# Patient Record
Sex: Male | Born: 1960 | Hispanic: No | Marital: Married | Smoking: Former smoker
Health system: Southern US, Community
[De-identification: ages and names within clinical notes are randomized; demographics above are authoritative.]

## PROBLEM LIST (undated history)

## (undated) DIAGNOSIS — I10 Essential (primary) hypertension: Secondary | ICD-10-CM

## (undated) HISTORY — PX: HERNIA REPAIR: SHX51

## (undated) HISTORY — PX: APPENDECTOMY: SHX54

---

## 2010-07-02 ENCOUNTER — Emergency Department (HOSPITAL_BASED_OUTPATIENT_CLINIC_OR_DEPARTMENT_OTHER): Admission: EM | Admit: 2010-07-02 | Discharge: 2010-07-02 | Payer: Self-pay | Admitting: Emergency Medicine

## 2010-12-21 ENCOUNTER — Ambulatory Visit (HOSPITAL_BASED_OUTPATIENT_CLINIC_OR_DEPARTMENT_OTHER)
Admission: RE | Admit: 2010-12-21 | Discharge: 2010-12-21 | Disposition: A | Discharge status: Home / Self Care | Payer: PRIVATE HEALTH INSURANCE | Source: Ambulatory Visit | Attending: Orthopedic Surgery | Admitting: Orthopedic Surgery

## 2010-12-21 DIAGNOSIS — E119 Type 2 diabetes mellitus without complications: Secondary | ICD-10-CM | POA: Insufficient documentation

## 2010-12-21 DIAGNOSIS — G5620 Lesion of ulnar nerve, unspecified upper limb: Secondary | ICD-10-CM | POA: Insufficient documentation

## 2010-12-21 LAB — BASIC METABOLIC PANEL
BUN: 16 mg/dL (ref 6–23)
CO2: 25 mEq/L (ref 19–32)
Chloride: 98 mEq/L (ref 96–112)
GFR calc Af Amer: >60 mL/min (ref >60)
Potassium: 4.2 mEq/L (ref 3.5–5.1)

## 2010-12-22 ENCOUNTER — Ambulatory Visit (HOSPITAL_BASED_OUTPATIENT_CLINIC_OR_DEPARTMENT_OTHER)
Admission: RE | Admit: 2010-12-22 | Discharge: 2010-12-22 | Disposition: A | Discharge status: Home / Self Care | Payer: PRIVATE HEALTH INSURANCE | Source: Ambulatory Visit | Attending: Orthopedic Surgery | Admitting: Orthopedic Surgery

## 2010-12-22 DIAGNOSIS — E669 Obesity, unspecified: Secondary | ICD-10-CM | POA: Insufficient documentation

## 2010-12-22 DIAGNOSIS — E119 Type 2 diabetes mellitus without complications: Secondary | ICD-10-CM | POA: Insufficient documentation

## 2010-12-22 DIAGNOSIS — Z01810 Encounter for preprocedural cardiovascular examination: Secondary | ICD-10-CM | POA: Insufficient documentation

## 2010-12-22 DIAGNOSIS — G5620 Lesion of ulnar nerve, unspecified upper limb: Secondary | ICD-10-CM | POA: Insufficient documentation

## 2010-12-22 DIAGNOSIS — Z01812 Encounter for preprocedural laboratory examination: Secondary | ICD-10-CM | POA: Insufficient documentation

## 2010-12-22 LAB — GLUCOSE, CAPILLARY
Glucose-Capillary: 215 mg/dL — ABNORMAL HIGH (ref 70–99)
Glucose-Capillary: 191 mg/dL — ABNORMAL HIGH (ref 70–99)

## 2011-02-18 NOTE — Op Note (Signed)
Kenneth Paul, Kenneth Paul               ACCOUNT NO.:  [id number]  MEDICAL RECORD NO.:  [id number]           PATIENT TYPE:  LOCATION:                                 FACILITY:  PHYSICIAN:  Cindee Salt, M.D.       DATE OF BIRTH:  [birth year]  DATE OF PROCEDURE:  12/22/2010 DATE OF DISCHARGE:                              OPERATIVE REPORT   PREOPERATIVE DIAGNOSIS:  Cubital tunnel syndrome, right arm.  POSTOPERATIVE DIAGNOSIS:  Cubital tunnel syndrome, right arm.  OPERATION:  Subcutaneous transposition with epineurolysis, right ulnar nerve.  SURGEON:  Cindee Salt, MD  ANESTHESIA:  General.  ANESTHESIOLOGIST:  Dr. Jean Rosenthal.  HISTORY:  The patient is a 50 year old male with a history of pain, numbness, and tingling of his right ulnar nerve distribution.  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  He does have diabetes. He has elected to undergo surgical decompression and transposition. Pre, peri, postoperative course have been discussed along with risks and complications.  He is aware that there is no guarantee with the surgery, possibility of infection, recurrence of injury to arteries, nerves, tendons, incomplete relief of symptoms, dystrophy.  In the preoperative area, the patient is seen, the extremity marked by both the patient and surgeon, antibiotic given.  PROCEDURE:  The patient was brought to the operating room where a general anesthetic was carried out without difficulty.  He was prepped using DuraPrep, supine position, right arm free.  A 3-minute dry time was allowed, time-out taken, confirming the patient and procedure.  The limb was exsanguinated with an Esmarch bandage.  Tourniquet placed high on the arm was inflated to 250 mmHg.  An incision was made over the medial epicondyle, carried down through subcutaneous tissue.  Bleeders were electrocauterized.  Posterior branches of the medial antebrachial cutaneous nerve to the forearm were identified.  These  were protected. The dissection carried down through [facility] fascia.  A markedly enlarged ulnar nerve was immediately apparent with a significant compression distally at the level of the entrance into the flexor carpi ulnaris. The nerve was actually very firm and hard in consistency.  With blunt and sharp dissection, a fasciotomy was performed to the flexor carpi ulnaris.  The dissection carried proximally to the arcade of Struthers. The medial intermuscular septum was then detached proximally, left attached to the most lateral aspect of the medial epicondyle distally. Bleeders were electrocauterized with bipolar.  The ulnar nerve and its vascular supply was then meticulously dissected free.  An epineurolysis was performed to the nerve and that the epineurium was markedly thickened.  The nerve was then anteriorly transposed.  A second sling was created distally by taking a portion of the flexor carpi ulnaris origin leaving it attached to the most ulnar aspect of the medial epicondyle.  The nerve was anteriorly transposed.  This was then copiously irrigated with saline.  The two slings were then sutured to the fascia and the subdermal plexus.  The skin was then closed.  There was no further subluxation back to its normal position.  The subcutaneous tissue was then closed with interrupted 2-0 Vicryl and the skin with  a subcuticular 4-0 Vicryl Rapide suture.  A local infiltration with 0.25% Marcaine without epinephrine was given, 10 mL was used.  A sterile compressive dressing, long-arm splint was applied.  On deflation of the tourniquet, all fingers immediately pinked.  He was taken to the recovery room for observation in satisfactory condition.  He will be discharged home to return to the [facility] in 1 week on Vicodin.          ______________________________ Cindee Salt, M.D.     GK/MEDQ  D:  12/22/2010  T:  12/22/2010  Job:  [id number]  Electronically Signed by Cindee Salt M.D. on 02/18/2011 09:14:55 AM

## 2011-10-23 ENCOUNTER — Encounter (HOSPITAL_BASED_OUTPATIENT_CLINIC_OR_DEPARTMENT_OTHER): Payer: Self-pay | Admitting: *Deleted

## 2011-10-23 ENCOUNTER — Emergency Department (INDEPENDENT_AMBULATORY_CARE_PROVIDER_SITE_OTHER): Payer: Managed Care, Other (non HMO)

## 2011-10-23 ENCOUNTER — Emergency Department (HOSPITAL_BASED_OUTPATIENT_CLINIC_OR_DEPARTMENT_OTHER)
Admission: EM | Admit: 2011-10-23 | Discharge: 2011-10-23 | Disposition: A | Discharge status: Home / Self Care | Payer: Managed Care, Other (non HMO) | Attending: Emergency Medicine | Admitting: Emergency Medicine

## 2011-10-23 DIAGNOSIS — M542 Cervicalgia: Secondary | ICD-10-CM | POA: Insufficient documentation

## 2011-10-23 DIAGNOSIS — R29898 Other symptoms and signs involving the musculoskeletal system: Secondary | ICD-10-CM

## 2011-10-23 DIAGNOSIS — M25519 Pain in unspecified shoulder: Secondary | ICD-10-CM

## 2011-10-23 DIAGNOSIS — E119 Type 2 diabetes mellitus without complications: Secondary | ICD-10-CM

## 2011-10-23 DIAGNOSIS — M5412 Radiculopathy, cervical region: Secondary | ICD-10-CM | POA: Insufficient documentation

## 2011-10-23 MED ORDER — IBUPROFEN 800 MG PO TABS
800.0000 mg | ORAL_TABLET | Freq: Once | ORAL | Status: AC
  Administered 2011-10-23: 800 mg via ORAL
  Filled 2011-10-23: qty 1

## 2011-10-23 MED ORDER — IBUPROFEN 600 MG PO TABS: 600.0000 mg | ORAL_TABLET | Freq: Four times a day (QID) | ORAL | Status: AC | PRN

## 2011-10-23 NOTE — ED Notes (Signed)
rx x 1 given for ibuprofen 

## 2011-10-23 NOTE — ED Notes (Signed)
Ice pack provided

## 2011-10-23 NOTE — ED Provider Notes (Signed)
History  This chart was scribed for Kenneth Nielsen, MD by Bennett Scrape. This patient was seen in room MH10/MH10 and the patient's care was started at 12:52AM.  CSN: [id number]  Arrival date & time 10/23/11  4098   First MD Initiated Contact with Patient 10/23/11 0050      Chief Complaint  Patient presents with  . Shoulder Pain    Patient is a 51 y.o. male presenting with shoulder pain. The history is provided by the patient. No language interpreter was used.  Shoulder Pain This is a new problem. The current episode started more than 2 days ago. The problem occurs constantly. The problem has been gradually worsening. Pertinent negatives include no chest pain, no abdominal pain, no headaches and no shortness of breath. The symptoms are relieved by nothing. He has tried nothing for the symptoms.    Kenneth Paul is a 51 y.o. male who presents to the Emergency Department complaining of one week of gradual onset, gradually worsening, constant left shoulder pain with associated weakness. Pt reports that movement makes the pain worse. He rates his pain a 6 or 7 out of 10 at its worse and currently. He has not taken any medications PTA to improve symptoms. He denies any recent injuries as the cause of the pain. He denies any increase in strenuous activity or heavy lifting. He denies any other injuries or illnesses at this time. He has a h/o diabetes. Pt is a former smoker but denies alcohol use.  Pt is currently going to school.  Past Medical History  Diagnosis Date  . Diabetes mellitus     Past Surgical History  Procedure Date  . Appendectomy   . Hernia repair     No family history on file.  History  Substance Use Topics  . Smoking status: Former Games developer  . Smokeless tobacco: Never Used  . Alcohol Use: No      Review of Systems  Constitutional: Negative for fever and chills.  Respiratory: Negative for cough and shortness of breath.   Cardiovascular: Negative for chest pain.   Gastrointestinal: Negative for nausea, vomiting, abdominal pain and diarrhea.  Musculoskeletal: Negative for back pain and joint swelling.       Left shoulder pain  Skin: Negative for rash.  Neurological: Positive for weakness (Increased weakness of left shoulder). Negative for headaches.  All other systems reviewed and are negative.    Allergies  Tetanus toxoids  Home Medications  No current outpatient prescriptions on file.  Triage Vitals: BP 143/91  Pulse 86  Temp(Src) 97.9 F (36.6 C) (Oral)  Resp 18  SpO2 98%   Physical Exam  Nursing note and vitals reviewed. Constitutional: He is oriented to person, place, and time. He appears well-developed and well-nourished.  HENT:  Head: Normocephalic and atraumatic.  Eyes: Conjunctivae and EOM are normal.  Neck: Normal range of motion. Neck supple.  Cardiovascular: Normal rate, regular rhythm and normal heart sounds.   Pulmonary/Chest: Effort normal and breath sounds normal.  Abdominal: Soft. There is no tenderness.  Musculoskeletal: He exhibits tenderness (Reproducible pain with movement and palpation of the left shoulder; no deformity noted, mild left paracervical tenderess). He exhibits no edema.  Neurological: He is alert and oriented to person, place, and time. No cranial nerve deficit.       Neurovascularly intact, strength is normal  Skin: Skin is warm and dry.  Psychiatric: He has a normal mood and affect. His behavior is normal.    ED Course  Procedures (  including critical care time)  DIAGNOSTIC STUDIES: Oxygen Saturation is 98% on room air, normal by my interpretation.    COORDINATION OF CARE: 12:54AM-Discussed treatment plan and x-ray of left shoulder with pt and pt agreed to plan.   Results for orders placed during the hospital encounter of 12/22/10  GLUCOSE, CAPILLARY      Component Value Range   Glucose-Capillary 215 (*) 70 - 99 (mg/dL)  GLUCOSE, CAPILLARY      Component Value Range   Glucose-Capillary  191 (*) 70 - 99 (mg/dL)  POCT HEMOGLOBIN-HEMACUE      Component Value Range   Hemoglobin 13.4  13.0 - 17.0 (g/dL)   Dg Cervical Spine Complete  10/23/2011  *RADIOLOGY REPORT*  Clinical Data: Left shoulder pain and weakness, history diabetes  CERVICAL SPINE - COMPLETE 4+ VIEW  Comparison: 07/02/2010  Findings: Osseous mineralization normal. Disc space narrowing C5-C6. Vertebral body and remaining disc space heights maintained. Prevertebral soft tissues normal thickness. Bony foramina patent. No acute fracture, subluxation, or bone destruction. Visualized upper lungs clear.  IMPRESSION: Mild degenerative disc disease changes C5-C6. No acute abnormalities.  Original Report Authenticated By: Lollie Marrow, M.D.   Dg Shoulder Left  10/23/2011  *RADIOLOGY REPORT*  Clinical Data: Left shoulder pain for 1 week, left shoulder weakness, history diabetes  LEFT SHOULDER - 2+ VIEW  Comparison: None  Findings: AC joint alignment normal. Bone mineralization normal. No glenohumeral fracture or dislocation. Visualized ribs intact.  IMPRESSION: Normal exam.  Original Report Authenticated By: Lollie Marrow, M.D.   Ice and Motrin.    MDM  Presentation suggests cervical radiculopathy with progressive symptoms over the last week. Plan sling, ice, NSAIDs and orthopedic followup. Reliable historian and verbalizes understanding neck and shoulder pain precautions.     I personally performed the services described in this documentation, which was scribed in my presence. The recorded information has been reviewed and considered.         Kenneth Nielsen, MD 10/23/11 [phone]

## 2011-10-23 NOTE — Discharge Instructions (Signed)
Cervical Radiculopathy Cervical radiculopathy happens when a nerve in the neck is pinched or bruised by a slipped (herniated) disk or by arthritic changes in the bones of the cervical spine. This can occur due to an injury or as part of the normal aging process. Pressure on the cervical nerves can cause pain or numbness that runs from your neck all the way down into your arm and fingers. CAUSES  There are many possible causes, including:  Injury.   Muscle tightness in the neck from overuse.   Swollen, painful joints (arthritis).   Breakdown or degeneration in the bones and joints of the spine (spondylosis) due to aging.   Bone spurs that may develop near the cervical nerves.  SYMPTOMS  Symptoms include pain, weakness, or numbness in the affected arm and hand. Pain can be severe or irritating. Symptoms may be worse when extending or turning the neck. DIAGNOSIS  Your caregiver will ask about your symptoms and do a physical exam. He or she may test your strength and reflexes. X-rays, CT scans, and MRI scans may be needed in cases of injury or if the symptoms do not go away after a period of time. Electromyography (EMG) or nerve conduction testing may be done to study how your nerves and muscles are working. TREATMENT  Your caregiver may recommend certain exercises to help relieve your symptoms. Cervical radiculopathy can, and often does, get better with time and treatment. If your problems continue, treatment options may include:  Wearing a soft collar for short periods of time.   Physical therapy to strengthen the neck muscles.   Medicines, such as nonsteroidal anti-inflammatory drugs (NSAIDs), oral corticosteroids, or spinal injections.   Surgery. Different types of surgery may be done depending on the cause of your problems.  HOME CARE INSTRUCTIONS   Put ice on the affected area.   Put ice in a plastic bag.   Place a towel between your skin and the bag.   Leave the ice on for 15  to 20 minutes, 3 to 4 times a day or as directed by your caregiver.   Use a flat pillow when you sleep.   Only take over-the-counter or prescription medicines for pain, discomfort, or fever as directed by your caregiver.   If physical therapy was prescribed, follow your caregiver's directions.   If a soft collar was prescribed, use it as directed.  SEEK IMMEDIATE MEDICAL CARE IF:   Your pain gets much worse and cannot be controlled with medicines.   You have weakness or numbness in your hand, arm, face, or leg.   You have a high fever or a stiff, rigid neck.   You lose bowel or bladder control (incontinence).   You have trouble with walking, balance, or speaking.  MAKE SURE YOU:   Understand these instructions.   Will watch your condition.   Will get help right away if you are not doing well or get worse.  Document Released: 04/12/2001 Document Revised: 07/07/2011 Document Reviewed: 03/01/2011 Exit[employer] Patient Information 2012 [employer]. 

## 2011-10-23 NOTE — ED Notes (Signed)
Pt reports left shoulder pain x 1 week- pain worse since Friday, now worse with movement and radiates down arm and into neck- has been using vick's rub for pain- denies chest pain

## 2012-01-02 ENCOUNTER — Ambulatory Visit: Payer: PRIVATE HEALTH INSURANCE | Attending: Specialist | Admitting: Physical Therapy

## 2012-01-02 DIAGNOSIS — M25519 Pain in unspecified shoulder: Secondary | ICD-10-CM | POA: Insufficient documentation

## 2012-01-02 DIAGNOSIS — M25619 Stiffness of unspecified shoulder, not elsewhere classified: Secondary | ICD-10-CM | POA: Insufficient documentation

## 2012-01-02 DIAGNOSIS — IMO0001 Reserved for inherently not codable concepts without codable children: Secondary | ICD-10-CM | POA: Insufficient documentation

## 2012-01-04 ENCOUNTER — Ambulatory Visit: Payer: PRIVATE HEALTH INSURANCE | Admitting: Rehabilitation

## 2012-01-10 ENCOUNTER — Ambulatory Visit: Payer: PRIVATE HEALTH INSURANCE | Admitting: Physical Therapy

## 2012-01-11 ENCOUNTER — Encounter: Payer: PRIVATE HEALTH INSURANCE | Admitting: Rehabilitation

## 2012-01-13 ENCOUNTER — Ambulatory Visit: Payer: PRIVATE HEALTH INSURANCE | Admitting: Physical Therapy

## 2012-01-17 ENCOUNTER — Ambulatory Visit: Payer: PRIVATE HEALTH INSURANCE | Admitting: Physical Therapy

## 2012-01-24 ENCOUNTER — Ambulatory Visit: Payer: PRIVATE HEALTH INSURANCE | Admitting: Rehabilitation

## 2012-01-25 ENCOUNTER — Ambulatory Visit: Payer: PRIVATE HEALTH INSURANCE | Admitting: Rehabilitation

## 2012-01-30 ENCOUNTER — Ambulatory Visit: Payer: Managed Care, Other (non HMO) | Attending: Specialist

## 2012-01-30 DIAGNOSIS — IMO0001 Reserved for inherently not codable concepts without codable children: Secondary | ICD-10-CM | POA: Insufficient documentation

## 2012-01-30 DIAGNOSIS — M25619 Stiffness of unspecified shoulder, not elsewhere classified: Secondary | ICD-10-CM | POA: Insufficient documentation

## 2012-01-30 DIAGNOSIS — M25519 Pain in unspecified shoulder: Secondary | ICD-10-CM | POA: Insufficient documentation

## 2012-02-01 ENCOUNTER — Ambulatory Visit: Payer: Managed Care, Other (non HMO) | Admitting: Rehabilitation

## 2012-02-07 ENCOUNTER — Ambulatory Visit: Payer: Managed Care, Other (non HMO) | Admitting: Rehabilitation

## 2012-02-09 ENCOUNTER — Ambulatory Visit: Payer: Managed Care, Other (non HMO) | Admitting: Rehabilitation

## 2012-02-10 ENCOUNTER — Ambulatory Visit: Payer: Managed Care, Other (non HMO) | Admitting: Rehabilitation

## 2012-02-13 ENCOUNTER — Ambulatory Visit: Payer: Managed Care, Other (non HMO) | Admitting: Physical Therapy

## 2012-02-16 ENCOUNTER — Ambulatory Visit: Payer: Managed Care, Other (non HMO) | Admitting: Physical Therapy

## 2012-02-20 ENCOUNTER — Ambulatory Visit: Payer: Managed Care, Other (non HMO) | Admitting: Rehabilitation

## 2012-02-22 ENCOUNTER — Ambulatory Visit: Payer: Managed Care, Other (non HMO) | Admitting: Rehabilitation

## 2012-02-27 ENCOUNTER — Ambulatory Visit: Payer: Managed Care, Other (non HMO) | Admitting: Rehabilitation

## 2012-02-29 ENCOUNTER — Encounter: Payer: PRIVATE HEALTH INSURANCE | Admitting: Physical Therapy

## 2012-03-05 ENCOUNTER — Ambulatory Visit: Payer: Managed Care, Other (non HMO) | Attending: Specialist | Admitting: Physical Therapy

## 2012-03-05 DIAGNOSIS — M25619 Stiffness of unspecified shoulder, not elsewhere classified: Secondary | ICD-10-CM | POA: Insufficient documentation

## 2012-03-05 DIAGNOSIS — M25519 Pain in unspecified shoulder: Secondary | ICD-10-CM | POA: Insufficient documentation

## 2012-03-05 DIAGNOSIS — IMO0001 Reserved for inherently not codable concepts without codable children: Secondary | ICD-10-CM | POA: Insufficient documentation

## 2012-03-08 ENCOUNTER — Ambulatory Visit: Payer: Managed Care, Other (non HMO) | Admitting: Rehabilitation

## 2012-03-12 ENCOUNTER — Ambulatory Visit: Payer: Managed Care, Other (non HMO) | Admitting: Rehabilitation

## 2012-03-15 ENCOUNTER — Ambulatory Visit: Payer: Managed Care, Other (non HMO) | Admitting: Rehabilitation

## 2012-03-19 ENCOUNTER — Ambulatory Visit: Payer: Managed Care, Other (non HMO) | Admitting: Rehabilitation

## 2012-03-22 ENCOUNTER — Ambulatory Visit: Payer: Managed Care, Other (non HMO) | Admitting: Rehabilitation

## 2012-04-09 ENCOUNTER — Ambulatory Visit: Payer: Managed Care, Other (non HMO) | Attending: Specialist | Admitting: Physical Therapy

## 2012-04-09 DIAGNOSIS — M25619 Stiffness of unspecified shoulder, not elsewhere classified: Secondary | ICD-10-CM | POA: Insufficient documentation

## 2012-04-09 DIAGNOSIS — IMO0001 Reserved for inherently not codable concepts without codable children: Secondary | ICD-10-CM | POA: Insufficient documentation

## 2012-04-09 DIAGNOSIS — M25519 Pain in unspecified shoulder: Secondary | ICD-10-CM | POA: Insufficient documentation

## 2012-04-11 ENCOUNTER — Encounter: Payer: PRIVATE HEALTH INSURANCE | Admitting: Physical Therapy

## 2012-04-13 ENCOUNTER — Ambulatory Visit: Payer: Managed Care, Other (non HMO) | Admitting: Physical Therapy

## 2012-04-16 ENCOUNTER — Ambulatory Visit: Payer: Managed Care, Other (non HMO) | Admitting: Physical Therapy

## 2012-04-19 ENCOUNTER — Ambulatory Visit: Payer: Managed Care, Other (non HMO) | Admitting: Rehabilitation

## 2012-04-23 ENCOUNTER — Ambulatory Visit: Payer: Managed Care, Other (non HMO) | Admitting: Physical Therapy

## 2012-04-26 ENCOUNTER — Ambulatory Visit: Payer: Managed Care, Other (non HMO) | Admitting: Rehabilitation

## 2012-04-30 ENCOUNTER — Encounter: Payer: PRIVATE HEALTH INSURANCE | Admitting: Physical Therapy

## 2012-05-03 ENCOUNTER — Ambulatory Visit: Payer: Managed Care, Other (non HMO) | Attending: Specialist | Admitting: Rehabilitation

## 2012-05-03 DIAGNOSIS — M25519 Pain in unspecified shoulder: Secondary | ICD-10-CM | POA: Insufficient documentation

## 2012-05-03 DIAGNOSIS — M25619 Stiffness of unspecified shoulder, not elsewhere classified: Secondary | ICD-10-CM | POA: Insufficient documentation

## 2012-05-03 DIAGNOSIS — IMO0001 Reserved for inherently not codable concepts without codable children: Secondary | ICD-10-CM | POA: Insufficient documentation

## 2012-05-08 ENCOUNTER — Ambulatory Visit: Payer: Managed Care, Other (non HMO) | Admitting: Physical Therapy

## 2012-05-10 ENCOUNTER — Encounter: Payer: PRIVATE HEALTH INSURANCE | Admitting: Rehabilitation

## 2012-05-10 ENCOUNTER — Ambulatory Visit: Payer: Managed Care, Other (non HMO) | Admitting: Rehabilitation

## 2012-05-28 ENCOUNTER — Ambulatory Visit: Payer: Managed Care, Other (non HMO) | Admitting: Physical Therapy

## 2012-06-04 ENCOUNTER — Ambulatory Visit: Payer: Managed Care, Other (non HMO) | Attending: Specialist | Admitting: Rehabilitation

## 2012-06-04 DIAGNOSIS — M25519 Pain in unspecified shoulder: Secondary | ICD-10-CM | POA: Insufficient documentation

## 2012-06-04 DIAGNOSIS — M25619 Stiffness of unspecified shoulder, not elsewhere classified: Secondary | ICD-10-CM | POA: Insufficient documentation

## 2012-06-04 DIAGNOSIS — IMO0001 Reserved for inherently not codable concepts without codable children: Secondary | ICD-10-CM | POA: Insufficient documentation

## 2012-06-11 ENCOUNTER — Encounter: Payer: Managed Care, Other (non HMO) | Admitting: Physical Therapy

## 2012-06-18 ENCOUNTER — Ambulatory Visit: Payer: Managed Care, Other (non HMO) | Admitting: Rehabilitation

## 2012-06-25 ENCOUNTER — Ambulatory Visit: Payer: Managed Care, Other (non HMO) | Admitting: Physical Therapy

## 2014-11-12 ENCOUNTER — Emergency Department (HOSPITAL_BASED_OUTPATIENT_CLINIC_OR_DEPARTMENT_OTHER)
Admission: EM | Admit: 2014-11-12 | Discharge: 2014-11-12 | Disposition: A | Discharge status: Home / Self Care | Payer: BLUE CROSS/BLUE SHIELD | Attending: Emergency Medicine | Admitting: Emergency Medicine

## 2014-11-12 ENCOUNTER — Encounter (HOSPITAL_BASED_OUTPATIENT_CLINIC_OR_DEPARTMENT_OTHER): Payer: Self-pay | Admitting: Emergency Medicine

## 2014-11-12 DIAGNOSIS — R739 Hyperglycemia, unspecified: Secondary | ICD-10-CM

## 2014-11-12 DIAGNOSIS — I10 Essential (primary) hypertension: Secondary | ICD-10-CM | POA: Diagnosis not present

## 2014-11-12 DIAGNOSIS — Z87891 Personal history of nicotine dependence: Secondary | ICD-10-CM | POA: Diagnosis not present

## 2014-11-12 DIAGNOSIS — Z79899 Other long term (current) drug therapy: Secondary | ICD-10-CM | POA: Insufficient documentation

## 2014-11-12 DIAGNOSIS — E1165 Type 2 diabetes mellitus with hyperglycemia: Secondary | ICD-10-CM | POA: Diagnosis not present

## 2014-11-12 HISTORY — DX: Essential (primary) hypertension: I10

## 2014-11-12 LAB — BASIC METABOLIC PANEL
ANION GAP: 7 (ref 5–15)
BUN: 11 mg/dL (ref 6–23)
CHLORIDE: 101 mmol/L (ref 96–112)
CO2: 26 mmol/L (ref 19–32)
CREATININE: 0.87 mg/dL (ref 0.50–1.35)
Calcium: 8.7 mg/dL (ref 8.4–10.5)
GFR calc non Af Amer: >90 mL/min (ref >90)
Glucose, Bld: 358 mg/dL — ABNORMAL HIGH (ref 70–99)
POTASSIUM: 4 mmol/L (ref 3.5–5.1)
SODIUM: 134 mmol/L — AB (ref 135–145)

## 2014-11-12 LAB — CBC WITH DIFFERENTIAL/PLATELET
BASOS PCT: 0 % (ref 0–1)
Basophils Absolute: 0 10*3/uL (ref 0.0–0.1)
EOS ABS: 0.2 10*3/uL (ref 0.0–0.7)
EOS PCT: 5 % (ref 0–5)
HEMATOCRIT: 38.6 % — AB (ref 39.0–52.0)
Hemoglobin: 12.4 g/dL — ABNORMAL LOW (ref 13.0–17.0)
Lymphocytes Relative: 39 % (ref 12–46)
Lymphs Abs: 1.3 10*3/uL (ref 0.7–4.0)
MCH: 27.3 pg (ref 26.0–34.0)
MCHC: 32.1 g/dL (ref 30.0–36.0)
MCV: 85 fL (ref 78.0–100.0)
MONO ABS: 0.4 10*3/uL (ref 0.1–1.0)
MONOS PCT: 12 % (ref 3–12)
NEUTROS ABS: 1.6 10*3/uL — AB (ref 1.7–7.7)
Neutrophils Relative %: 44 % (ref 43–77)
Platelets: 142 10*3/uL — ABNORMAL LOW (ref 150–400)
RBC: 4.54 MIL/uL (ref 4.22–5.81)
RDW: 12.9 % (ref 11.5–15.5)
WBC: 3.5 10*3/uL — ABNORMAL LOW (ref 4.0–10.5)

## 2014-11-12 LAB — CBG MONITORING, ED
GLUCOSE-CAPILLARY: 368 mg/dL — AB (ref 70–99)
GLUCOSE-CAPILLARY: 227 mg/dL — AB (ref 70–99)
GLUCOSE-CAPILLARY: 190 mg/dL — AB (ref 70–99)

## 2014-11-12 LAB — TROPONIN I: Troponin I: <0.03 ng/mL (ref <0.031)

## 2014-11-12 MED ORDER — SODIUM CHLORIDE 0.9 % IV SOLN
INTRAVENOUS | Status: DC
  Administered 2014-11-12: 05:00:00 via INTRAVENOUS

## 2014-11-12 MED ORDER — SODIUM CHLORIDE 0.9 % IV BOLUS (SEPSIS)
1000.0000 mL | Freq: Once | INTRAVENOUS | Status: AC
Start: 2014-11-12 — End: 2014-11-12
  Administered 2014-11-12: 1000 mL via INTRAVENOUS

## 2014-11-12 NOTE — ED Provider Notes (Addendum)
CSN: [id number]     Arrival date & time 11/12/14  0302 History   None    Chief Complaint  Patient presents with  . Hyperglycemia     (Consider location/radiation/quality/duration/timing/severity/associated sxs/prior Treatment) HPI  This is a 54 year old male with a history of diabetes. He has been out of his medications for the last 3 days and has not been able to get his prescriptions refilled due to changing insurance plans. This morning about 1 AM he awoke feeling unusually weak. He checked his blood sugar and found to be 339. He called his insurance company and they advised that he go to the emergency department. He has been thirsty but denies increased urination. He denies nausea, vomiting or diarrhea. He denies shortness of breath. He had a mild feeling earlier in his chest that "was like a pain but not like a pain". This has improved. His sugar on arrival was 368.  Past Medical History  Diagnosis Date  . Diabetes mellitus   . Hypertension    Past Surgical History  Procedure Laterality Date  . Appendectomy    . Hernia repair     No family history on file. History  Substance Use Topics  . Smoking status: Former Games developer  . Smokeless tobacco: Never Used  . Alcohol Use: No    Review of Systems  All other systems reviewed and are negative.   Allergies  Tetanus toxoids  Home Medications   Prior to Admission medications   Medication Sig Start Date End Date Taking? Authorizing Provider  atorvastatin (LIPITOR) 10 MG tablet Take 10 mg by mouth daily.   Yes Historical Provider, MD  gabapentin (NEURONTIN) 300 MG capsule Take 300 mg by mouth 3 (three) times daily.   Yes Historical Provider, MD  glimepiride (AMARYL) 4 MG tablet Take 4 mg by mouth daily with breakfast.   Yes Historical Provider, MD  lisinopril (PRINIVIL,ZESTRIL) 20 MG tablet Take 20 mg by mouth daily.   Yes Historical Provider, MD  metFORMIN (GLUCOPHAGE) 1000 MG tablet Take 1,000 mg by mouth 2 (two) times daily  with a meal.   Yes Historical Provider, MD   BP 148/94 mmHg  Pulse 70  Temp(Src) 97.5 F (36.4 C) (Oral)  Resp 16  Ht 6' (1.829 m)  Wt 214 lb (97.07 kg)  BMI 29.02 kg/m2  SpO2 99%   Physical Exam  General: Well-developed, well-nourished male in no acute distress; appearance consistent with age of record HENT: normocephalic; atraumatic Eyes: pupils equal, round and reactive to light; extraocular muscles intact Neck: supple Heart: regular rate and rhythm Lungs: clear to auscultation bilaterally Abdomen: soft; nondistended; nontender; bowel sounds present Extremities: No deformity; full range of motion; pulses normal Neurologic: Awake, alert and oriented; motor function intact in all extremities and symmetric; no facial droop Skin: Warm and dry Psychiatric: Normal mood and affect    ED Course  Procedures (including critical care time)   EKG Interpretation   Date/Time:  Wednesday November 12 2014 03:34:59 EDT Ventricular Rate:  84 PR Interval:  164 QRS Duration: 90 QT Interval:  390 QTC Calculation: 460 R Axis:   -10 Text Interpretation:  Normal sinus rhythm Left ventricular hypertrophy  Nonspecific T wave abnormality Prolonged QT Abnormal ECG No significant  change was found Confirmed by Read Drivers  MD, Jonny Ruiz ([id number]) on 11/12/2014  3:47:51 AM      MDM  Nursing notes and vitals signs, including pulse oximetry, reviewed.  Summary of this visit's results, reviewed by myself:  Labs:  Results  for orders placed or performed during the hospital encounter of 11/12/14 (from the past 24 hour(s))  CBC with Differential/Platelet     Status: Abnormal   Collection Time: 11/12/14  4:33 AM  Result Value Ref Range   WBC 3.5 (L) 4.0 - 10.5 K/uL   RBC 4.54 4.22 - 5.81 MIL/uL   Hemoglobin 12.4 (L) 13.0 - 17.0 g/dL   HCT 96.038.6 (L) 45.439.0 - 09.852.0 %   MCV 85.0 78.0 - 100.0 fL   MCH 27.3 26.0 - 34.0 pg   MCHC 32.1 30.0 - 36.0 g/dL   RDW 11.912.9 14.711.5 - 82.915.5 %   Platelets 142 (L) 150 - 400 K/uL    Neutrophils Relative % 44 43 - 77 %   Neutro Abs 1.6 (L) 1.7 - 7.7 K/uL   Lymphocytes Relative 39 12 - 46 %   Lymphs Abs 1.3 0.7 - 4.0 K/uL   Monocytes Relative 12 3 - 12 %   Monocytes Absolute 0.4 0.1 - 1.0 K/uL   Eosinophils Relative 5 0 - 5 %   Eosinophils Absolute 0.2 0.0 - 0.7 K/uL   Basophils Relative 0 0 - 1 %   Basophils Absolute 0.0 0.0 - 0.1 K/uL  Basic metabolic panel     Status: Abnormal   Collection Time: 11/12/14  4:33 AM  Result Value Ref Range   Sodium 134 (L) 135 - 145 mmol/L   Potassium 4.0 3.5 - 5.1 mmol/L   Chloride 101 96 - 112 mmol/L   CO2 26 19 - 32 mmol/L   Glucose, Bld 358 (H) 70 - 99 mg/dL   BUN 11 6 - 23 mg/dL   Creatinine, Ser 5.620.87 0.50 - 1.35 mg/dL   Calcium 8.7 8.4 - 13.010.5 mg/dL   GFR calc non Af Amer >90 >90 mL/min   GFR calc Af Amer >90 >90 mL/min   Anion gap 7 5 - 15  Troponin I     Status: None   Collection Time: 11/12/14  4:33 AM  Result Value Ref Range   Troponin I <0.03 <0.031 ng/mL  CBG monitoring, ED     Status: Abnormal   Collection Time: 11/12/14  5:52 AM  Result Value Ref Range   Glucose-Capillary 227 (H) 70 - 99 mg/dL  CBG monitoring, ED     Status: Abnormal   Collection Time: 11/12/14  6:48 AM  Result Value Ref Range   Glucose-Capillary 190 (H) 70 - 99 mg/dL   8:656:54 AM Glucose now [id number] after IV fluids and insulin drip. Patient was advised of the importance of getting his prescriptions filled and taking them regularly as prescribed.    Paula LibraJohn Reshonda Koerber, MD 11/12/14 [phone]  Paula LibraJohn Alexxis Mackert, MD 11/12/14 [phone]

## 2014-11-12 NOTE — ED Notes (Signed)
368 CBG reading at triage

## 2014-11-12 NOTE — ED Notes (Signed)
Pt has not had medication for BP or diabetes in over two weeks.

## 2014-11-12 NOTE — ED Notes (Signed)
States sugar is high(at home 339) called insurance and was told to come to emergency.  Has not had meds x 3 days.  Feels weak and has some upper chest discomfort(but states not pain)

## 2014-11-12 NOTE — Discharge Instructions (Signed)
High Blood Sugar °High blood sugar (hyperglycemia) means that the level of sugar in your blood is higher than it should be. Signs of high blood sugar include: °· Feeling thirsty. °· Frequent peeing (urinating). °· Feeling tired or sleepy. °· Dry mouth. °· Vision changes. °· Feeling weak. °· Feeling hungry but losing weight. °· Numbness and tingling in your hands or feet. °· Headache. °When you ignore these signs, your blood sugar may keep going up. These problems may get worse, and other problems may begin. °HOME CARE °· Check your blood sugars as told by your doctor. Write down the numbers with the date and time. °· Take the right amount of insulin or diabetes pills at the right time. Write down the dose with date and time. °· Refill your insulin or diabetes pills before running out. °· Watch what you eat. Follow your meal plan. °· Drink liquids without sugar, such as water. Check with your doctor if you have kidney or heart disease. °· Follow your doctor's orders for exercise. Exercise at the same time of day. °· Keep your doctor's appointments. °GET HELP RIGHT AWAY IF:  °· You have trouble thinking or are confused. °· You have fast breathing with fruity smelling breath. °· You pass out (faint). °· You have 2 to 3 days of high blood sugars and you do not know why. °· You have chest pain. °· You are feeling sick to your stomach (nauseous) or throwing up (vomiting). °· You have sudden vision changes. °MAKE SURE YOU:  °· Understand these instructions. °· Will watch your condition. °· Will get help right away if you are not doing well or get worse. °Document Released: 05/15/2009 Document Revised: 10/10/2011 Document Reviewed: 05/15/2009 °ExitCare® Patient Information ©2015 [employer]. This information is not intended to replace advice given to you by your health care provider. Make sure you discuss any questions you have with your health care provider. ° °

## 2015-05-19 ENCOUNTER — Encounter (HOSPITAL_BASED_OUTPATIENT_CLINIC_OR_DEPARTMENT_OTHER): Payer: Self-pay | Admitting: *Deleted

## 2015-05-19 ENCOUNTER — Emergency Department (HOSPITAL_BASED_OUTPATIENT_CLINIC_OR_DEPARTMENT_OTHER)
Admission: EM | Admit: 2015-05-19 | Discharge: 2015-05-20 | Disposition: A | Discharge status: Home / Self Care | Payer: BLUE CROSS/BLUE SHIELD | Attending: Emergency Medicine | Admitting: Emergency Medicine

## 2015-05-19 DIAGNOSIS — Z79899 Other long term (current) drug therapy: Secondary | ICD-10-CM | POA: Insufficient documentation

## 2015-05-19 DIAGNOSIS — I10 Essential (primary) hypertension: Secondary | ICD-10-CM | POA: Insufficient documentation

## 2015-05-19 DIAGNOSIS — E1165 Type 2 diabetes mellitus with hyperglycemia: Secondary | ICD-10-CM | POA: Insufficient documentation

## 2015-05-19 DIAGNOSIS — Z87891 Personal history of nicotine dependence: Secondary | ICD-10-CM | POA: Insufficient documentation

## 2015-05-19 DIAGNOSIS — Z760 Encounter for issue of repeat prescription: Secondary | ICD-10-CM | POA: Insufficient documentation

## 2015-05-19 DIAGNOSIS — R739 Hyperglycemia, unspecified: Secondary | ICD-10-CM

## 2015-05-19 LAB — CBG MONITORING, ED: Glucose-Capillary: 358 mg/dL — ABNORMAL HIGH (ref 65–99)

## 2015-05-19 NOTE — ED Notes (Signed)
Pt a diabetic.  Reports that he has not been taking his medication as he should because he no longer has insurance. States that tonight he had an episode of feeling weak. Pt ambulatory, no acute distress noted.

## 2015-05-20 MED ORDER — GABAPENTIN 300 MG PO CAPS: 300.0000 mg | ORAL_CAPSULE | Freq: Three times a day (TID) | ORAL | Status: DC

## 2015-05-20 MED ORDER — GLIMEPIRIDE 4 MG PO TABS: 4.0000 mg | ORAL_TABLET | Freq: Every day | ORAL | Status: DC

## 2015-05-20 MED ORDER — ATORVASTATIN CALCIUM 10 MG PO TABS: 10.0000 mg | ORAL_TABLET | Freq: Every day | ORAL | Status: DC

## 2015-05-20 MED ORDER — LISINOPRIL 20 MG PO TABS: 20.0000 mg | ORAL_TABLET | Freq: Every day | ORAL | Status: DC

## 2015-05-20 MED ORDER — METFORMIN HCL 500 MG PO TABS
1000.0000 mg | ORAL_TABLET | Freq: Once | ORAL | Status: AC
  Administered 2015-05-20: 1000 mg via ORAL
  Filled 2015-05-20: qty 2

## 2015-05-20 MED ORDER — INSULIN REGULAR HUMAN 100 UNIT/ML IJ SOLN
10.0000 [IU] | Freq: Once | INTRAMUSCULAR | Status: AC
  Administered 2015-05-20: 10 [IU] via SUBCUTANEOUS
  Filled 2015-05-20: qty 1

## 2015-05-20 MED ORDER — METFORMIN HCL 1000 MG PO TABS: 1000.0000 mg | ORAL_TABLET | Freq: Two times a day (BID) | ORAL | Status: AC

## 2015-05-20 MED ORDER — SODIUM CHLORIDE 0.9 % IV BOLUS (SEPSIS)
1000.0000 mL | Freq: Once | INTRAVENOUS | Status: AC
  Administered 2015-05-20: 1000 mL via INTRAVENOUS

## 2015-05-20 NOTE — ED Provider Notes (Addendum)
CSN: [id number]     Arrival date & time 05/19/15  2325 History   First MD Initiated Contact with Patient 05/20/15 0100     Chief Complaint  Patient presents with  . Blood Sugar Problem     (Consider location/radiation/quality/duration/timing/severity/associated sxs/prior Treatment) HPI  This is a 54 year old male with a history of diabetes and hypertension. He has not taken any medications for the past 2 days because he lost his insurance and his PCP would not refill them. He has felt weak since yesterday. He has had frequent thirst but does not report frequent urination or blurred vision. He is requesting refills of his medications pending new insurance; they have been in contact with the Department of Social Services. His sugar was noted to be 358 on arrival. He is feeling better after being given an IV fluid bolus prior to my evaluation.  Past Medical History  Diagnosis Date  . Diabetes mellitus   . Hypertension    Past Surgical History  Procedure Laterality Date  . Appendectomy    . Hernia repair     History reviewed. No pertinent family history. Social History  Substance Use Topics  . Smoking status: Former Games developermoker  . Smokeless tobacco: Never Used  . Alcohol Use: No    Review of Systems  All other systems reviewed and are negative.   Allergies  Tetanus toxoids  Home Medications   Prior to Admission medications   Medication Sig Start Date End Date Taking? Authorizing Provider  atorvastatin (LIPITOR) 10 MG tablet Take 10 mg by mouth daily.    Historical Provider, MD  gabapentin (NEURONTIN) 300 MG capsule Take 300 mg by mouth 3 (three) times daily.    Historical Provider, MD  glimepiride (AMARYL) 4 MG tablet Take 4 mg by mouth daily with breakfast.    Historical Provider, MD  lisinopril (PRINIVIL,ZESTRIL) 20 MG tablet Take 20 mg by mouth daily.    Historical Provider, MD  metFORMIN (GLUCOPHAGE) 1000 MG tablet Take 1,000 mg by mouth 2 (two) times daily with a meal.     Historical Provider, MD   BP 154/84 mmHg  Pulse 82  Temp(Src) 97.5 F (36.4 C) (Oral)  Resp 18  Ht 6' (1.829 m)  Wt 210 lb (95.255 kg)  BMI 28.47 kg/m2  SpO2 100%   Physical Exam  General: Well-developed, well-nourished male in no acute distress; appearance consistent with age of record HENT: normocephalic; atraumatic Eyes: pupils equal, round and reactive to light; extraocular muscles intact Neck: supple Heart: regular rate and rhythm Lungs: clear to auscultation bilaterally Abdomen: soft; nondistended; nontender; bowel sounds present Extremities: No deformity; full range of motion; pulses normal Neurologic: Awake, alert and oriented; motor function intact in all extremities and symmetric; no facial droop Skin: Warm and dry Psychiatric: Normal mood and affect    ED Course  Procedures (including critical care time)   MDM   Nursing notes and vitals signs, including pulse oximetry, reviewed.  Summary of this visit's results, reviewed by myself:  Labs:  Results for orders placed or performed during the hospital encounter of 05/19/15 (from the past 24 hour(s))  CBG monitoring, ED     Status: Abnormal   Collection Time: 05/19/15 11:36 PM  Result Value Ref Range   Glucose-Capillary 358 (H) 65 - 99 mg/dL       Paula LibraJohn Angela Platner, MD 86/57/8410/19/16 [id number]  Paula LibraJohn Emelin Dascenzo, MD 05/20/15 [id number]

## 2015-05-20 NOTE — ED Notes (Signed)
MD at bedside. 

## 2017-10-07 ENCOUNTER — Other Ambulatory Visit: Payer: Self-pay

## 2017-10-07 ENCOUNTER — Encounter (HOSPITAL_BASED_OUTPATIENT_CLINIC_OR_DEPARTMENT_OTHER): Payer: Self-pay | Admitting: Emergency Medicine

## 2017-10-07 ENCOUNTER — Emergency Department (HOSPITAL_BASED_OUTPATIENT_CLINIC_OR_DEPARTMENT_OTHER)
Admission: EM | Admit: 2017-10-07 | Discharge: 2017-10-07 | Disposition: A | Discharge status: Home / Self Care | Payer: BLUE CROSS/BLUE SHIELD | Attending: Emergency Medicine | Admitting: Emergency Medicine

## 2017-10-07 DIAGNOSIS — K529 Noninfective gastroenteritis and colitis, unspecified: Secondary | ICD-10-CM | POA: Insufficient documentation

## 2017-10-07 DIAGNOSIS — K219 Gastro-esophageal reflux disease without esophagitis: Secondary | ICD-10-CM | POA: Insufficient documentation

## 2017-10-07 DIAGNOSIS — E119 Type 2 diabetes mellitus without complications: Secondary | ICD-10-CM | POA: Insufficient documentation

## 2017-10-07 DIAGNOSIS — Z79899 Other long term (current) drug therapy: Secondary | ICD-10-CM | POA: Insufficient documentation

## 2017-10-07 DIAGNOSIS — I10 Essential (primary) hypertension: Secondary | ICD-10-CM | POA: Insufficient documentation

## 2017-10-07 DIAGNOSIS — Z7984 Long term (current) use of oral hypoglycemic drugs: Secondary | ICD-10-CM | POA: Insufficient documentation

## 2017-10-07 DIAGNOSIS — R079 Chest pain, unspecified: Secondary | ICD-10-CM

## 2017-10-07 DIAGNOSIS — Z87891 Personal history of nicotine dependence: Secondary | ICD-10-CM | POA: Insufficient documentation

## 2017-10-07 LAB — TROPONIN I

## 2017-10-07 LAB — COMPREHENSIVE METABOLIC PANEL
ALBUMIN: 4.1 g/dL (ref 3.5–5.0)
ALT: 32 U/L (ref 17–63)
ANION GAP: 13 (ref 5–15)
AST: 39 U/L (ref 15–41)
Alkaline Phosphatase: 68 U/L (ref 38–126)
BUN: 16 mg/dL (ref 6–20)
CO2: 23 mmol/L (ref 22–32)
Calcium: 9.5 mg/dL (ref 8.9–10.3)
Chloride: 100 mmol/L — ABNORMAL LOW (ref 101–111)
Creatinine, Ser: 0.95 mg/dL (ref 0.61–1.24)
GFR calc Af Amer: >60 mL/min (ref >60)
GLUCOSE: 274 mg/dL — AB (ref 65–99)
POTASSIUM: 3.6 mmol/L (ref 3.5–5.1)
Sodium: 136 mmol/L (ref 135–145)
Total Bilirubin: 0.9 mg/dL (ref 0.3–1.2)
Total Protein: 7.3 g/dL (ref 6.5–8.1)

## 2017-10-07 LAB — CBC WITH DIFFERENTIAL/PLATELET
BASOS ABS: 0 10*3/uL (ref 0.0–0.1)
Basophils Relative: 0 %
Eosinophils Absolute: 0 10*3/uL (ref 0.0–0.7)
Eosinophils Relative: 0 %
HEMATOCRIT: 39.8 % (ref 39.0–52.0)
HEMOGLOBIN: 12.8 g/dL — AB (ref 13.0–17.0)
Lymphocytes Relative: 3 %
Lymphs Abs: 0.2 10*3/uL — ABNORMAL LOW (ref 0.7–4.0)
MCH: 27.4 pg (ref 26.0–34.0)
MCHC: 32.2 g/dL (ref 30.0–36.0)
MCV: 85.2 fL (ref 78.0–100.0)
Monocytes Absolute: 0.2 10*3/uL (ref 0.1–1.0)
Monocytes Relative: 3 %
NEUTROS ABS: 9.1 10*3/uL — AB (ref 1.7–7.7)
NEUTROS PCT: 94 %
Platelets: 131 10*3/uL — ABNORMAL LOW (ref 150–400)
RBC: 4.67 MIL/uL (ref 4.22–5.81)
RDW: 13.4 % (ref 11.5–15.5)
WBC: 9.6 10*3/uL (ref 4.0–10.5)

## 2017-10-07 MED ORDER — ONDANSETRON HCL 4 MG/2ML IJ SOLN
4.0000 mg | Freq: Once | INTRAMUSCULAR | Status: AC
  Administered 2017-10-07: 4 mg via INTRAVENOUS
  Filled 2017-10-07: qty 2

## 2017-10-07 MED ORDER — PANTOPRAZOLE SODIUM 40 MG PO TBEC: 40.0000 mg | DELAYED_RELEASE_TABLET | Freq: Every day | ORAL | 0 refills | Status: AC

## 2017-10-07 MED ORDER — ONDANSETRON 4 MG PO TBDP: 4.0000 mg | ORAL_TABLET | Freq: Three times a day (TID) | ORAL | 0 refills | Status: AC | PRN

## 2017-10-07 MED ORDER — GI COCKTAIL ~~LOC~~
30.0000 mL | Freq: Once | ORAL | Status: AC
  Administered 2017-10-07: 30 mL via ORAL
  Filled 2017-10-07: qty 30

## 2017-10-07 MED ORDER — SODIUM CHLORIDE 0.9 % IV BOLUS (SEPSIS)
1000.0000 mL | Freq: Once | INTRAVENOUS | Status: AC
  Administered 2017-10-07: 1000 mL via INTRAVENOUS

## 2017-10-07 NOTE — ED Notes (Signed)
ED Provider at bedside. 

## 2017-10-07 NOTE — ED Triage Notes (Signed)
Pt c/o feeling like heart is going "hot and fast" since 1800; also reports NVD since the same time

## 2017-10-07 NOTE — ED Provider Notes (Signed)
[facility] EMERGENCY DEPARTMENT Provider Note   CSN: [id number] Arrival date & time: 10/07/17  2050     History   Chief Complaint Chief Complaint  Patient presents with  . Palpitations    HPI Kenneth Paul is a 57 y.o. male.  HPI  57 year old male with a history of hypertension diabetes presents with vomiting and diarrhea.  Started around 6 PM tonight.  Significant other had vomiting 2 days ago.  Has not had blood in either the emesis or diarrhea.  No significant abdominal pain.  Has been having some chest burning since this started.  No fevers.  Patient has been feeling palpitations which she describes as his heart rate going fast.  However it is not skipping beats.  Past Medical History:  Diagnosis Date  . Diabetes mellitus   . Hypertension     There are no active problems to display for this patient.   Past Surgical History:  Procedure Laterality Date  . APPENDECTOMY    . HERNIA REPAIR         Home Medications    Prior to Admission medications   Medication Sig Start Date End Date Taking? Authorizing Provider  atorvastatin (LIPITOR) 10 MG tablet Take 1 tablet (10 mg total) by mouth daily. 05/20/15   Molpus, John, MD  gabapentin (NEURONTIN) 300 MG capsule Take 1 capsule (300 mg total) by mouth 3 (three) times daily. 05/20/15   Molpus, John, MD  glimepiride (AMARYL) 4 MG tablet Take 1 tablet (4 mg total) by mouth daily with breakfast. 05/20/15   Molpus, John, MD  lisinopril (PRINIVIL,ZESTRIL) 20 MG tablet Take 1 tablet (20 mg total) by mouth daily. 05/20/15   Molpus, Jonny RuizJohn, MD  metFORMIN (GLUCOPHAGE) 1000 MG tablet Take 1 tablet (1,000 mg total) by mouth 2 (two) times daily with a meal. 05/20/15   Molpus, John, MD  ondansetron (ZOFRAN ODT) 4 MG disintegrating tablet Take 1 tablet (4 mg total) by mouth every 8 (eight) hours as needed. 10/07/17   Pricilla LovelessGoldston, Catilyn Boggus, MD  pantoprazole (PROTONIX) 40 MG tablet Take 1 tablet (40 mg total) by mouth daily. 10/07/17    Pricilla LovelessGoldston, Sharmel Ballantine, MD    Family History No family history on file.  Social History Social History   Tobacco Use  . Smoking status: Former Games developermoker  . Smokeless tobacco: Never Used  Substance Use Topics  . Alcohol use: No  . Drug use: No     Allergies   Tetanus toxoids   Review of Systems Review of Systems  Constitutional: Negative for fever.  Cardiovascular: Positive for chest pain (burning) and palpitations.  Gastrointestinal: Positive for diarrhea, nausea and vomiting. Negative for abdominal pain and blood in stool.  All other systems reviewed and are negative.    Physical Exam Updated Vital Signs BP (!) 144/95   Pulse 99   Temp (!) 97.5 F (36.4 C) (Oral)   Resp 18   Ht 6' (1.829 m)   Wt 96.6 kg (213 lb)   SpO2 95%   BMI 28.89 kg/m   Physical Exam  Constitutional: He is oriented to person, place, and time. He appears well-developed and well-nourished. No distress.  HENT:  Head: Normocephalic and atraumatic.  Right Ear: External ear normal.  Left Ear: External ear normal.  Nose: Nose normal.  Eyes: Right eye exhibits no discharge. Left eye exhibits no discharge.  Neck: Neck supple.  Cardiovascular: Normal rate, regular rhythm and normal heart sounds.  Pulmonary/Chest: Effort normal and breath sounds normal. He exhibits no  tenderness.  Abdominal: Soft. He exhibits no distension. There is no tenderness.  Musculoskeletal: He exhibits no edema.  Neurological: He is alert and oriented to person, place, and time.  Skin: Skin is warm and dry. He is not diaphoretic.  Nursing note and vitals reviewed.    ED Treatments / Results  Labs (all labs ordered are listed, but only abnormal results are displayed) Labs Reviewed  COMPREHENSIVE METABOLIC PANEL - Abnormal; Notable for the following components:      Result Value   Chloride 100 (*)    Glucose, Bld 274 (*)    All other components within normal limits  CBC WITH DIFFERENTIAL/PLATELET - Abnormal; Notable for  the following components:   Hemoglobin 12.8 (*)    Platelets 131 (*)    Neutro Abs 9.1 (*)    Lymphs Abs 0.2 (*)    All other components within normal limits  TROPONIN I    EKG  EKG Interpretation  Date/Time:  Saturday October 07 2017 20:53:55 EST Ventricular Rate:  98 PR Interval:  148 QRS Duration: 92 QT Interval:  376 QTC Calculation: 480 R Axis:   -6 Text Interpretation:  Normal sinus rhythm Possible Left atrial enlargement Left ventricular hypertrophy Nonspecific T wave abnormality Prolonged QT Abnormal ECG no significant change since April 2016 Confirmed by Pricilla Loveless [phone]) on 10/07/2017 9:06:45 PM       Radiology No results found.  Procedures Procedures (including critical care time)  Medications Ordered in ED Medications  sodium chloride 0.9 % bolus 1,000 mL (0 mLs Intravenous Stopped 10/07/17 2300)  ondansetron (ZOFRAN) injection 4 mg (4 mg Intravenous Given 10/07/17 2225)  gi cocktail (Maalox,Lidocaine,Donnatal) (30 mLs Oral Given 10/07/17 2225)     Initial Impression / Assessment and Plan / ED Course  I have reviewed the triage vital signs and the nursing notes.  Pertinent labs & imaging results that were available during my care of the patient were reviewed by me and considered in my medical decision making (see chart for details).     Patient presents with symptoms consistent with acute gastroenteritis with vomiting and diarrhea.  I think his chest burning is related to GERD/reflux.  However he does have hypertension and diabetes so a troponin was sent.  His ECG is nonspecific but unchanged.  I highly doubt this is ACS.  His abdominal exam is benign.  He is tolerating oral fluids.  He appears stable for discharge home.  Symptomatic care will add Protonix.  He recently had an endoscopy that showed gastroparesis but he is currently swallowing fluids without difficulty.  Return precautions.  Final Clinical Impressions(s) / ED Diagnoses   Final diagnoses:    Gastroenteritis  Chest pain due to GERD    ED Discharge Orders        Ordered    ondansetron (ZOFRAN ODT) 4 MG disintegrating tablet  Every 8 hours PRN     10/07/17 2323    pantoprazole (PROTONIX) 40 MG tablet  Daily     10/07/17 2323       Pricilla Loveless, MD 10/07/17 2334

## 2017-10-08 ENCOUNTER — Encounter (HOSPITAL_BASED_OUTPATIENT_CLINIC_OR_DEPARTMENT_OTHER): Payer: Self-pay | Admitting: *Deleted

## 2017-10-08 ENCOUNTER — Other Ambulatory Visit: Payer: Self-pay

## 2017-10-08 ENCOUNTER — Emergency Department (HOSPITAL_BASED_OUTPATIENT_CLINIC_OR_DEPARTMENT_OTHER)
Admission: EM | Admit: 2017-10-08 | Discharge: 2017-10-08 | Disposition: A | Discharge status: Home / Self Care | Payer: BLUE CROSS/BLUE SHIELD | Attending: Emergency Medicine | Admitting: Emergency Medicine

## 2017-10-08 DIAGNOSIS — K2289 Other specified disease of esophagus: Secondary | ICD-10-CM

## 2017-10-08 DIAGNOSIS — K228 Other specified diseases of esophagus: Secondary | ICD-10-CM

## 2017-10-08 DIAGNOSIS — Z79899 Other long term (current) drug therapy: Secondary | ICD-10-CM | POA: Insufficient documentation

## 2017-10-08 DIAGNOSIS — E119 Type 2 diabetes mellitus without complications: Secondary | ICD-10-CM | POA: Insufficient documentation

## 2017-10-08 DIAGNOSIS — R1013 Epigastric pain: Secondary | ICD-10-CM | POA: Insufficient documentation

## 2017-10-08 DIAGNOSIS — E860 Dehydration: Secondary | ICD-10-CM | POA: Insufficient documentation

## 2017-10-08 DIAGNOSIS — A084 Viral intestinal infection, unspecified: Secondary | ICD-10-CM | POA: Insufficient documentation

## 2017-10-08 DIAGNOSIS — I10 Essential (primary) hypertension: Secondary | ICD-10-CM | POA: Insufficient documentation

## 2017-10-08 DIAGNOSIS — Z87891 Personal history of nicotine dependence: Secondary | ICD-10-CM | POA: Insufficient documentation

## 2017-10-08 LAB — URINALYSIS, MICROSCOPIC (REFLEX)

## 2017-10-08 LAB — URINALYSIS, ROUTINE W REFLEX MICROSCOPIC
Bilirubin Urine: NEGATIVE
Glucose, UA: >=500 mg/dL — AB
Ketones, ur: NEGATIVE mg/dL
LEUKOCYTES UA: NEGATIVE
Nitrite: NEGATIVE
PROTEIN: 30 mg/dL — AB
Specific Gravity, Urine: >1.03 — ABNORMAL HIGH (ref 1.005–1.030)
pH: 5 (ref 5.0–8.0)

## 2017-10-08 LAB — TROPONIN I

## 2017-10-08 MED ORDER — ONDANSETRON HCL 4 MG/2ML IJ SOLN
4.0000 mg | Freq: Once | INTRAMUSCULAR | Status: AC
  Administered 2017-10-08: 4 mg via INTRAVENOUS
  Filled 2017-10-08: qty 2

## 2017-10-08 MED ORDER — SUCRALFATE 1 GM/10ML PO SUSP
1.0000 g | Freq: Once | ORAL | Status: AC
  Administered 2017-10-08: 1 g via ORAL
  Filled 2017-10-08: qty 10

## 2017-10-08 MED ORDER — LOPERAMIDE HCL 2 MG PO CAPS
4.0000 mg | ORAL_CAPSULE | Freq: Once | ORAL | Status: AC
  Administered 2017-10-08: 4 mg via ORAL
  Filled 2017-10-08: qty 2

## 2017-10-08 MED ORDER — SODIUM CHLORIDE 0.9 % IV BOLUS (SEPSIS)
1000.0000 mL | Freq: Once | INTRAVENOUS | Status: AC
  Administered 2017-10-08: 1000 mL via INTRAVENOUS

## 2017-10-08 NOTE — ED Notes (Signed)
ED Provider at bedside. 

## 2017-10-08 NOTE — ED Triage Notes (Signed)
Pt was here earlier and diagnosed with gastroenteritis. States he started having diarrhea again when he returned home. C/o general weakness. Denies any vomiting. C/o nausea. C/o anterior chest pain that is the same type pain as he had when he was here yesterday. Describes as constant type pain. Describes as sharp and burning.

## 2017-10-08 NOTE — ED Notes (Signed)
Instructions for a brat diet given to pt. Voiced understanding. Saltines and applesauce and low sugar Gatorade given to pt at dc for him to eat when he gets home. Denies nausea at present.

## 2017-10-08 NOTE — ED Provider Notes (Addendum)
[facility] Provider Note: Lowella Dell, MD, FACEP  CSN: [id number] MRN: [id number] ARRIVAL: 10/08/17 at 0334 ROOM: [facility]05/[facility]05   CHIEF COMPLAINT  Weakness   HISTORY OF PRESENT ILLNESS  10/08/17 3:56 AM Kenneth Paul is a 57 y.o. male with nausea, vomiting and diarrhea that began yesterday evening. He was seen here and diagnosed with gastroenteritis and burning chest pain due to GERD/vomiting.  He was treated with IV fluids, Zofran and a GI cocktail.  He returns complaining of a return of the burning pain in his chest along with generalized weakness.  He states he has had difficulty standing due to the weakness.  He is continued to have diarrhea as well.  He was not given an antidiarrheal earlier.  He rates the burning in his chest as a 9 out of 10.  He denies abdominal pain but states he has feeling of being full.   Past Medical History:  Diagnosis Date  . Diabetes mellitus   . Hypertension     Past Surgical History:  Procedure Laterality Date  . APPENDECTOMY    . HERNIA REPAIR      No family history on file.  Social History   Tobacco Use  . Smoking status: Former Games developer  . Smokeless tobacco: Never Used  Substance Use Topics  . Alcohol use: No  . Drug use: No    Prior to Admission medications   Medication Sig Start Date End Date Taking? Authorizing Provider  Pioglitazone HCl (ACTOS PO) Take by mouth.   Yes [provider]  atorvastatin (LIPITOR) 10 MG tablet Take 1 tablet (10 mg total) by mouth daily. 05/20/15   Trequan Marsolek, MD  gabapentin (NEURONTIN) 300 MG capsule Take 1 capsule (300 mg total) by mouth 3 (three) times daily. 05/20/15   Judyth Demarais, MD  glimepiride (AMARYL) 4 MG tablet Take 1 tablet (4 mg total) by mouth daily with breakfast. 05/20/15   Bintou Lafata, MD  lisinopril (PRINIVIL,ZESTRIL) 20 MG tablet Take 1 tablet (20 mg total) by mouth daily. 05/20/15   Amberlie Gaillard, Jonny Ruiz, MD  metFORMIN (GLUCOPHAGE) 1000 MG tablet Take 1 tablet (1,000  mg total) by mouth 2 (two) times daily with a meal. 05/20/15   Larysa Pall, MD  ondansetron (ZOFRAN ODT) 4 MG disintegrating tablet Take 1 tablet (4 mg total) by mouth every 8 (eight) hours as needed. 10/07/17   Pricilla Loveless, MD  pantoprazole (PROTONIX) 40 MG tablet Take 1 tablet (40 mg total) by mouth daily. 10/07/17   Pricilla Loveless, MD    Allergies Tetanus toxoids   REVIEW OF SYSTEMS  Negative except as noted here or in the History of Present Illness.   PHYSICAL EXAMINATION  Initial Vital Signs Blood pressure (!) 144/88, pulse 100, resp. rate (!) 21, height 6' (1.829 m), weight 96.6 kg (213 lb), SpO2 93 %.  Examination General: Well-developed, well-nourished male in no acute distress; appearance consistent with age of record HENT: normocephalic; atraumatic Eyes: pupils equal, round and reactive to light; extraocular muscles intact; arcus senilis bilaterally Neck: supple Heart: regular rate and rhythm Lungs: clear to auscultation bilaterally Abdomen: soft; nondistended; nontender; bowel sounds present Extremities: No deformity; full range of motion; pulses normal Neurologic: Awake, alert and oriented; motor function intact in all extremities and symmetric; no facial droop Skin: Warm and dry Psychiatric: Flat affect   RESULTS  Summary of this visit's results, reviewed by myself:   EKG Interpretation  Date/Time:    Ventricular Rate:    PR Interval:  QRS Duration:   QT Interval:    QTC Calculation:   R Axis:     Text Interpretation:        Laboratory Studies: Results for orders placed or performed during the hospital encounter of 10/08/17 (from the past 24 hour(s))  Troponin I     Status: None   Collection Time: 10/08/17  4:49 AM  Result Value Ref Range   Troponin I <0.03 <0.03 ng/mL  Urinalysis, Routine w reflex microscopic     Status: Abnormal   Collection Time: 10/08/17  4:49 AM  Result Value Ref Range   Color, Urine YELLOW YELLOW   APPearance CLEAR CLEAR    Specific Gravity, Urine >1.030 (H) 1.005 - 1.030   pH 5.0 5.0 - 8.0   Glucose, UA >=500 (A) NEGATIVE mg/dL   Hgb urine dipstick SMALL (A) NEGATIVE   Bilirubin Urine NEGATIVE NEGATIVE   Ketones, ur NEGATIVE NEGATIVE mg/dL   Protein, ur 30 (A) NEGATIVE mg/dL   Nitrite NEGATIVE NEGATIVE   Leukocytes, UA NEGATIVE NEGATIVE  Urinalysis, Microscopic (reflex)     Status: Abnormal   Collection Time: 10/08/17  4:49 AM  Result Value Ref Range   RBC / HPF 0-5 0 - 5 RBC/hpf   WBC, UA 0-5 0 - 5 WBC/hpf   Bacteria, UA RARE (A) NONE SEEN   Squamous Epithelial / LPF 0-5 (A) NONE SEEN   Imaging Studies: No results found.  ED COURSE  Nursing notes and initial vitals signs, including pulse oximetry, reviewed.  Vitals:   10/08/17 0343 10/08/17 0515 10/08/17 0530  BP:  137/88 (!) 144/88  Pulse:  (!) 101 100  Resp:  19 (!) 21  SpO2:  94% 93%  Weight: 96.6 kg (213 lb)    Height: 6' (1.829 m)     6:23 AM Patient's chest burning significantly improved after oral Carafate.  Patient able to stand without difficulty after IV fluid bolus.  He has not yet filled his prescriptions given earlier.  He was encouraged to fill these and to purchase some Maalox and Imodium as well for symptomatic relief of his heartburn and diarrhea, respectively.  PROCEDURES    ED DIAGNOSES     ICD-10-CM   1. Viral gastroenteritis A08.4   2. Esophageal pain K22.8   3. Dehydration E86.0        Americus Perkey, Jonny RuizJohn, MD 10/08/17 [id number]    Paula LibraMolpus, Vanetta Rule, MD 10/08/17 [phone]

## 2017-10-08 NOTE — ED Notes (Signed)
Pt seen here earlier, but went home. States felt weak and continued to have diarrhea. Abdomen feels full. +nausea. Returned to ED.

## 2019-08-03 ENCOUNTER — Emergency Department (HOSPITAL_BASED_OUTPATIENT_CLINIC_OR_DEPARTMENT_OTHER)
Admission: EM | Admit: 2019-08-03 | Discharge: 2019-08-03 | Disposition: A | Discharge status: Home / Self Care | Payer: BC Managed Care – PPO | Attending: Emergency Medicine | Admitting: Emergency Medicine

## 2019-08-03 ENCOUNTER — Emergency Department (HOSPITAL_BASED_OUTPATIENT_CLINIC_OR_DEPARTMENT_OTHER): Payer: BC Managed Care – PPO

## 2019-08-03 ENCOUNTER — Other Ambulatory Visit: Payer: Self-pay

## 2019-08-03 ENCOUNTER — Encounter (HOSPITAL_BASED_OUTPATIENT_CLINIC_OR_DEPARTMENT_OTHER): Payer: Self-pay | Admitting: Emergency Medicine

## 2019-08-03 DIAGNOSIS — Z79899 Other long term (current) drug therapy: Secondary | ICD-10-CM | POA: Insufficient documentation

## 2019-08-03 DIAGNOSIS — Z20822 Contact with and (suspected) exposure to covid-19: Secondary | ICD-10-CM | POA: Diagnosis not present

## 2019-08-03 DIAGNOSIS — Z87891 Personal history of nicotine dependence: Secondary | ICD-10-CM | POA: Insufficient documentation

## 2019-08-03 DIAGNOSIS — R059 Cough, unspecified: Secondary | ICD-10-CM

## 2019-08-03 DIAGNOSIS — E119 Type 2 diabetes mellitus without complications: Secondary | ICD-10-CM | POA: Diagnosis not present

## 2019-08-03 DIAGNOSIS — Z7984 Long term (current) use of oral hypoglycemic drugs: Secondary | ICD-10-CM | POA: Diagnosis not present

## 2019-08-03 DIAGNOSIS — I10 Essential (primary) hypertension: Secondary | ICD-10-CM | POA: Diagnosis not present

## 2019-08-03 DIAGNOSIS — R05 Cough: Secondary | ICD-10-CM | POA: Diagnosis present

## 2019-08-03 LAB — SARS CORONAVIRUS 2 (TAT 6-24 HRS): SARS Coronavirus 2: NEGATIVE

## 2019-08-03 MED ORDER — BENZONATATE 100 MG PO CAPS: 100.0000 mg | ORAL_CAPSULE | Freq: Three times a day (TID) | ORAL | 0 refills | Status: AC

## 2019-08-03 NOTE — Discharge Instructions (Signed)
You have been diagnosed today with cough, suspected COVID-19 viral infection.  At this time there does not appear to be the presence of an emergent medical condition, however there is always the potential for conditions to change. Please read and follow the below instructions.  Please return to the Emergency Department immediately for any new or worsening symptoms. Please be sure to follow up with your Primary Care Provider within one week regarding your visit today; please call their office to schedule an appointment even if you are feeling better for a follow-up visit. Your Covid test is pending and will result in 1-2 days, you may review your results on your MyChart account.  Please continue to quarantine until symptom-free for 1 week in case of a false negative test.  Please drink plenty water and get plenty of rest.  Please call your primary care doctor to discuss your results and schedule a follow-up telephone visit.  Return to the ER for any new or worsening symptoms. You may use the medication Tessalon as prescribed to help with your cough.  Get help right away if: You have trouble breathing. You have pain or pressure in your chest. You have confusion. You have bluish lips and fingernails. You have difficulty waking from sleep. You cough up blood. You have trouble breathing. Your heartbeat is very fast. You have any new/concerning or worsening of symtpoms  Please read the additional information packets attached to your discharge summary.  Do not take your medicine if  develop an itchy rash, swelling in your mouth or lips, or difficulty breathing; call 911 and seek immediate emergency medical attention if this occurs.  Note: Portions of this text may have been transcribed using voice recognition software. Every effort was made to ensure accuracy; however, inadvertent computerized transcription errors may still be present.

## 2019-08-03 NOTE — ED Notes (Signed)
SpO2 96% with ambulation.

## 2019-08-03 NOTE — ED Triage Notes (Signed)
Cough since last night. Wife is COVID+

## 2019-08-03 NOTE — ED Provider Notes (Signed)
[facility] EMERGENCY DEPARTMENT Provider Note   CSN: [id number] Arrival date & time: 08/03/19  1410     History Chief Complaint  Patient presents with  . Cough    Kenneth Paul is a 59 y.o. male history diabetes, hypertension.  Patient presents today for concern of COVID-19 viral infection and is requesting test.  He reports that his wife recently tested positive for the virus.  Patient reports that last night he developed a mild nonproductive cough with no other symptoms.  He reports that he is otherwise feeling well.  Denies fever/chills, headache, neck pain, chest pain/shortness of breath, hemoptysis, sputum production, nausea/vomiting, diarrhea, abdominal pain, extremity swelling/color change or any additional concerns.  HPI     Past Medical History:  Diagnosis Date  . Diabetes mellitus   . Hypertension     There are no problems to display for this patient.   Past Surgical History:  Procedure Laterality Date  . APPENDECTOMY    . HERNIA REPAIR         No family history on file.  Social History   Tobacco Use  . Smoking status: Former Research scientist (life sciences)  . Smokeless tobacco: Never Used  Substance Use Topics  . Alcohol use: No  . Drug use: No    Home Medications Prior to Admission medications   Medication Sig Start Date End Date Taking? Authorizing Provider  benzonatate (TESSALON) 100 MG capsule Take 1 capsule (100 mg total) by mouth every 8 (eight) hours for 5 days. 08/03/19 08/08/19  Nuala Alpha A, PA-C  metFORMIN (GLUCOPHAGE) 1000 MG tablet Take 1 tablet (1,000 mg total) by mouth 2 (two) times daily with a meal. 05/20/15   Molpus, John, MD  ondansetron (ZOFRAN ODT) 4 MG disintegrating tablet Take 1 tablet (4 mg total) by mouth every 8 (eight) hours as needed. 10/07/17   Sherwood Gambler, MD  pantoprazole (PROTONIX) 40 MG tablet Take 1 tablet (40 mg total) by mouth daily. 10/07/17   Sherwood Gambler, MD  Pioglitazone HCl (ACTOS PO) Take by mouth.    [provider]    Allergies    Tetanus toxoids  Review of Systems   Review of Systems Ten systems are reviewed and are negative for acute change except as noted in the HPI  Physical Exam Updated Vital Signs BP (!) 152/91 (BP Location: Right Arm)   Pulse 92   Temp 98.4 F (36.9 C) (Oral)   Resp 20   Ht 5\' 11"  (1.803 m)   Wt 96.6 kg   SpO2 96%   BMI 29.71 kg/m   Physical Exam Constitutional:      General: He is not in acute distress.    Appearance: Normal appearance. He is well-developed. He is not ill-appearing or diaphoretic.  HENT:     Head: Normocephalic and atraumatic.     Right Ear: External ear normal.     Left Ear: External ear normal.     Nose: Nose normal.     Mouth/Throat:     Mouth: Mucous membranes are moist.     Pharynx: Oropharynx is clear.  Eyes:     General: Vision grossly intact. Gaze aligned appropriately.     Pupils: Pupils are equal, round, and reactive to light.  Neck:     Trachea: Trachea and phonation normal. No tracheal deviation.  Cardiovascular:     Rate and Rhythm: Normal rate and regular rhythm.     Pulses: Normal pulses.     Heart sounds: Normal heart sounds.  Pulmonary:     Effort: Pulmonary effort is normal. No respiratory distress.     Breath sounds: Normal breath sounds.  Abdominal:     General: There is no distension.     Palpations: Abdomen is soft.     Tenderness: There is no abdominal tenderness. There is no guarding or rebound.  Musculoskeletal:        General: No tenderness. Normal range of motion.     Cervical back: Normal range of motion and neck supple.     Right lower leg: No edema.     Left lower leg: No edema.  Skin:    General: Skin is warm and dry.  Neurological:     Mental Status: He is alert.     GCS: GCS eye subscore is 4. GCS verbal subscore is 5. GCS motor subscore is 6.     Comments: Speech is clear and goal oriented, follows commands Major Cranial nerves without deficit, no facial droop Moves  extremities without ataxia, coordination intact  Psychiatric:        Behavior: Behavior normal.    ED Results / Procedures / Treatments   Labs (all labs ordered are listed, but only abnormal results are displayed) Labs Reviewed  SARS CORONAVIRUS 2 (TAT 6-24 HRS)    EKG None  Radiology DG Chest Portable 1 View  Result Date: 08/03/2019 CLINICAL DATA:  Cough since last night. Wife is COVID+ EXAM: PORTABLE CHEST 1 VIEW COMPARISON:  None. FINDINGS: Mediastinal contours within normal limits. Heart size appears upper limits of normal which may be secondary to AP technique. The lungs are clear. No pneumothorax or large pleural effusion. No acute finding in the visualized skeleton IMPRESSION: No active disease in the chest. Electronically Signed   By: Emmaline Kluver M.D.   On: 08/03/2019 16:28    Procedures Procedures (including critical care time)  Medications Ordered in ED Medications - No data to display  ED Course  I have reviewed the triage vital signs and the nursing notes.  Pertinent labs & imaging results that were available during my care of the patient were reviewed by me and considered in my medical decision making (see chart for details).    MDM Rules/Calculators/A&P                     59 year old male presents today requesting COVID-19 test.  Wife recently tested positive.  Patient's only symptom is dry nonproductive cough beginning last night.  Denies any associated chest pain, shortness of breath, fever or additional concerns.  He is well-appearing no acute distress, no cranial nerve deficits, airway patent without evidence of deep space infection or tonsillitis, no meningeal signs, heart regular rate and rhythm without murmur, lungs clear to auscultation bilaterally, abdomen soft nontender no peritoneal signs, neurovascular intact to all 4 extremities without evidence of DVT.  Chest x-ray without active disease.  He is ambulated by nursing staff with SPO2 96% on room air  throughout and no shortness of breath.  Plan of care is outpatient Covid test discharged to home with PCP follow-up.  There is no indication for further work-up or admission of this patient.  Suspect patient with viral URI due to likely Covid given patient's close contact, no evidence of PE, allergic reaction, angioedema, ACE/ARB cough or other emergent pathologies at this time.  At this time there does not appear to be any evidence of an acute emergency medical condition and the patient appears stable for discharge with appropriate outpatient follow  up. Diagnosis was discussed with patient who verbalizes understanding of care plan and is agreeable to discharge. I have discussed return precautions with patient who verbalizes understanding of return precautions. Patient encouraged to follow-up with their PCP. All questions answered.  Patient has been discharged in good condition.  Lily Kernen was evaluated in Emergency Department on 08/03/2019 for the symptoms described in the history of present illness. He was evaluated in the context of the global COVID-19 pandemic, which necessitated consideration that the patient might be at risk for infection with the SARS-CoV-2 virus that causes COVID-19. Institutional protocols and algorithms that pertain to the evaluation of patients at risk for COVID-19 are in a state of rapid change based on information released by regulatory bodies including the CDC and federal and state organizations. These policies and algorithms were followed during the patient's care in the ED.  Note: Portions of this report may have been transcribed using voice recognition software. Every effort was made to ensure accuracy; however, inadvertent computerized transcription errors may still be present. Final Clinical Impression(s) / ED Diagnoses Final diagnoses:  Cough  Suspected COVID-19 virus infection    Rx / DC Orders ED Discharge Orders         Ordered    benzonatate (TESSALON) 100  MG capsule  Every 8 hours     08/03/19 1746           Elizabeth Palau 08/03/19 1747    Glynn Octave, MD 08/03/19 2332

## 2020-07-08 IMAGING — DX DG CHEST 1V PORT
1 series · 1 of 1 positions shown · non-contrast
Comparison: None.

CLINICAL DATA: Cough since last night. Wife is COVID+

EXAM:
PORTABLE CHEST 1 VIEW

[chest ap]
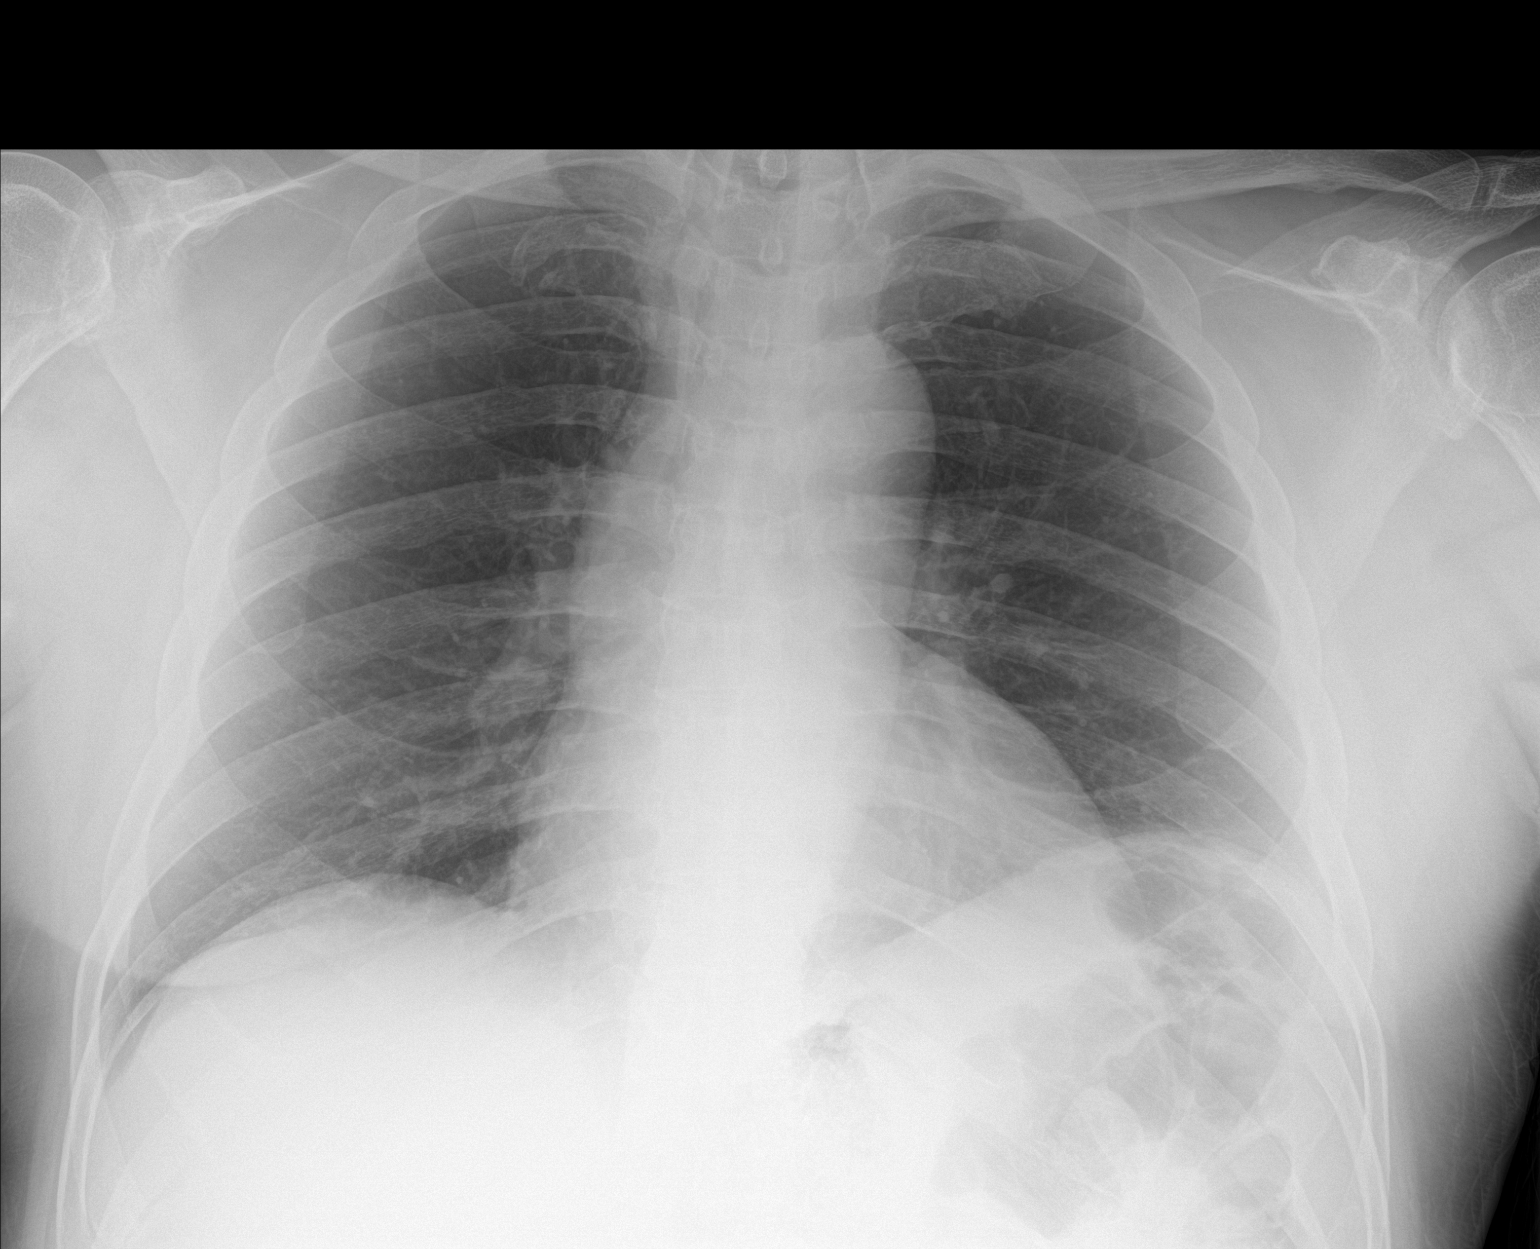

[1 of 1 positions shown; findings below may reference images not displayed]

FINDINGS: Mediastinal contours within normal limits. Heart size appears upper
limits of normal which may be secondary to AP technique. The lungs
are clear. No pneumothorax or large pleural effusion. No acute
finding in the visualized skeleton
IMPRESSION: No active disease in the chest.

## 2020-08-31 ENCOUNTER — Other Ambulatory Visit: Payer: Self-pay

## 2020-08-31 DIAGNOSIS — I10 Essential (primary) hypertension: Secondary | ICD-10-CM | POA: Insufficient documentation

## 2020-08-31 DIAGNOSIS — Z20822 Contact with and (suspected) exposure to covid-19: Secondary | ICD-10-CM | POA: Insufficient documentation

## 2020-08-31 DIAGNOSIS — E119 Type 2 diabetes mellitus without complications: Secondary | ICD-10-CM | POA: Insufficient documentation

## 2020-08-31 DIAGNOSIS — R630 Anorexia: Secondary | ICD-10-CM | POA: Insufficient documentation

## 2020-08-31 DIAGNOSIS — Z7984 Long term (current) use of oral hypoglycemic drugs: Secondary | ICD-10-CM | POA: Diagnosis not present

## 2020-08-31 DIAGNOSIS — R509 Fever, unspecified: Secondary | ICD-10-CM | POA: Diagnosis not present

## 2020-08-31 DIAGNOSIS — R519 Headache, unspecified: Secondary | ICD-10-CM | POA: Diagnosis not present

## 2020-08-31 DIAGNOSIS — Z87891 Personal history of nicotine dependence: Secondary | ICD-10-CM | POA: Diagnosis not present

## 2020-09-01 ENCOUNTER — Encounter (HOSPITAL_BASED_OUTPATIENT_CLINIC_OR_DEPARTMENT_OTHER): Payer: Self-pay | Admitting: *Deleted

## 2020-09-01 ENCOUNTER — Emergency Department (HOSPITAL_BASED_OUTPATIENT_CLINIC_OR_DEPARTMENT_OTHER)
Admission: EM | Admit: 2020-09-01 | Discharge: 2020-09-01 | Disposition: A | Discharge status: Home / Self Care | Payer: BC Managed Care – PPO | Attending: Emergency Medicine | Admitting: Emergency Medicine

## 2020-09-01 ENCOUNTER — Other Ambulatory Visit: Payer: Self-pay

## 2020-09-01 DIAGNOSIS — Z20822 Contact with and (suspected) exposure to covid-19: Secondary | ICD-10-CM

## 2020-09-01 LAB — SARS CORONAVIRUS 2 (TAT 6-24 HRS): SARS Coronavirus 2: NEGATIVE

## 2020-09-01 MED ORDER — BENZONATATE 100 MG PO CAPS
100.0000 mg | ORAL_CAPSULE | Freq: Once | ORAL | Status: AC
  Administered 2020-09-01: 100 mg via ORAL
  Filled 2020-09-01: qty 1

## 2020-09-01 MED ORDER — BENZONATATE 100 MG PO CAPS
100.0000 mg | ORAL_CAPSULE | Freq: Three times a day (TID) | ORAL | 0 refills | Status: AC | PRN
Start: 2020-09-01 — End: ?

## 2020-09-01 MED ORDER — ACETAMINOPHEN 325 MG PO TABS
ORAL_TABLET | ORAL | Status: AC
  Administered 2020-09-01: 650 mg via ORAL
  Filled 2020-09-01: qty 2

## 2020-09-01 MED ORDER — ACETAMINOPHEN 325 MG PO TABS: 650.0000 mg | ORAL_TABLET | Freq: Once | ORAL | Status: AC

## 2020-09-01 MED ORDER — NAPROXEN 375 MG PO TABS: ORAL_TABLET | ORAL | 0 refills | Status: AC

## 2020-09-01 MED ORDER — NAPROXEN 250 MG PO TABS
500.0000 mg | ORAL_TABLET | Freq: Once | ORAL | Status: AC
  Administered 2020-09-01: 500 mg via ORAL
  Filled 2020-09-01: qty 2

## 2020-09-01 NOTE — ED Triage Notes (Addendum)
C/o bil leg swelling, h/a , chills, fever   x 2 days

## 2020-09-01 NOTE — ED Provider Notes (Signed)
[facility] Provider Note: Lowella Dell, MD, FACEP  CSN: [id number] MRN: [id number] ARRIVAL: 08/31/20 at 2356 ROOM: MH02/[facility]02   CHIEF COMPLAINT  covid sx   HISTORY OF PRESENT ILLNESS  09/01/20 5:21 AM Kenneth Paul is a 60 y.o. male with 2 days of fever, chills, generalized body aches, cough, headache and loss of appetite. He is also noticed some swelling of his lower legs. He rates his body aches as moderate in severity. He has not had shortness of breath or loss of taste or smell. He has not taken anything for his symptoms but was given Tylenol soon after arrival. His temperature on arrival was as high as 100.3.    Past Medical History:  Diagnosis Date  . Diabetes mellitus   . Hypertension     Past Surgical History:  Procedure Laterality Date  . APPENDECTOMY    . HERNIA REPAIR      No family history on file.  Social History   Tobacco Use  . Smoking status: Former Games developer  . Smokeless tobacco: Never Used  Substance Use Topics  . Alcohol use: No  . Drug use: No    Prior to Admission medications   Medication Sig Start Date End Date Taking? Authorizing Provider  benzonatate (TESSALON) 100 MG capsule Take 1 capsule (100 mg total) by mouth 3 (three) times daily as needed for cough. 09/01/20  Yes Abril Cappiello, MD  naproxen (NAPROSYN) 375 MG tablet Take 1 tablet twice daily as needed for fever or body aches. 09/01/20  Yes Abdiaziz Klahn, MD  glimepiride (AMARYL) 4 MG tablet Take 4 mg by mouth every morning. 06/10/20   [provider]  metFORMIN (GLUCOPHAGE) 1000 MG tablet Take 1 tablet (1,000 mg total) by mouth 2 (two) times daily with a meal. 05/20/15   Zurii Hewes, MD  ondansetron (ZOFRAN ODT) 4 MG disintegrating tablet Take 1 tablet (4 mg total) by mouth every 8 (eight) hours as needed. 10/07/17   Pricilla Loveless, MD  pantoprazole (PROTONIX) 40 MG tablet Take 1 tablet (40 mg total) by mouth daily. 10/07/17   Pricilla Loveless, MD  Pioglitazone HCl (ACTOS PO)  Take by mouth.    [provider]    Allergies Tetanus toxoids   REVIEW OF SYSTEMS  Negative except as noted here or in the History of Present Illness.   PHYSICAL EXAMINATION  Initial Vital Signs Blood pressure (!) 142/89, pulse 99, temperature 98.7 F (37.1 C), temperature source Oral, resp. rate 17, height 6' (1.829 m), weight 96.6 kg, SpO2 98 %.  Examination General: Well-developed, well-nourished male in no acute distress; appearance consistent with age of record HENT: normocephalic; atraumatic; pharynx normal Eyes: pupils equal, round and reactive to light; extraocular muscles intact Neck: supple Heart: regular rate and rhythm Lungs: clear to auscultation bilaterally Abdomen: soft; nondistended; nontender; bowel sounds present Extremities: No deformity; full range of motion; pulses normal; trace edema of lower legs Neurologic: Awake, alert and oriented; motor function intact in all extremities and symmetric; no facial droop Skin: Warm and dry Psychiatric: Normal mood and affect   RESULTS  Summary of this visit's results, reviewed and interpreted by myself:   EKG Interpretation  Date/Time:    Ventricular Rate:    PR Interval:    QRS Duration:   QT Interval:    QTC Calculation:   R Axis:     Text Interpretation:        Laboratory Studies: No results found for this or any previous visit (from the  past 24 hour(s)). Imaging Studies: No results found.  ED COURSE and MDM  Nursing notes, initial and subsequent vitals signs, including pulse oximetry, reviewed and interpreted by myself.  Vitals:   09/01/20 0007 09/01/20 0010 09/01/20 0449  BP:  (!) 144/87 (!) 142/89  Pulse: (!) 102  99  Resp: 18  17  Temp: 100.3 F (37.9 C)  98.7 F (37.1 C)  TempSrc: Oral  Oral  SpO2: 100%  98%  Weight: 96.6 kg    Height: 6' (1.829 m)     Medications  naproxen (NAPROSYN) tablet 500 mg (has no administration in time range)  benzonatate (TESSALON) capsule 100 mg  (has no administration in time range)  acetaminophen (TYLENOL) tablet 650 mg (650 mg Oral Given 09/01/20 0010)   Symptoms are concerning for Covid. We will treat his cough and body aches and test for Covid. We will have him return if symptoms worsen, particularly for shortness of breath.   PROCEDURES  Procedures   ED DIAGNOSES     ICD-10-CM   1. Suspected COVID-19 virus infection  Z20.822        Adreanne Yono, Jonny Ruiz, MD 09/01/20 [id number]

## 2020-09-24 HISTORY — PX: FOOT SURGERY: SHX648

## 2021-03-21 ENCOUNTER — Encounter (HOSPITAL_BASED_OUTPATIENT_CLINIC_OR_DEPARTMENT_OTHER): Payer: Self-pay | Admitting: *Deleted

## 2021-03-21 ENCOUNTER — Emergency Department (HOSPITAL_BASED_OUTPATIENT_CLINIC_OR_DEPARTMENT_OTHER)
Admission: EM | Admit: 2021-03-21 | Discharge: 2021-03-21 | Disposition: A | Discharge status: Home / Self Care | Payer: BC Managed Care – PPO | Attending: Emergency Medicine | Admitting: Emergency Medicine

## 2021-03-21 ENCOUNTER — Emergency Department (HOSPITAL_BASED_OUTPATIENT_CLINIC_OR_DEPARTMENT_OTHER): Payer: BC Managed Care – PPO

## 2021-03-21 ENCOUNTER — Other Ambulatory Visit: Payer: Self-pay

## 2021-03-21 DIAGNOSIS — I10 Essential (primary) hypertension: Secondary | ICD-10-CM | POA: Diagnosis not present

## 2021-03-21 DIAGNOSIS — Z87891 Personal history of nicotine dependence: Secondary | ICD-10-CM | POA: Diagnosis not present

## 2021-03-21 DIAGNOSIS — Z7984 Long term (current) use of oral hypoglycemic drugs: Secondary | ICD-10-CM | POA: Insufficient documentation

## 2021-03-21 DIAGNOSIS — E119 Type 2 diabetes mellitus without complications: Secondary | ICD-10-CM | POA: Diagnosis not present

## 2021-03-21 DIAGNOSIS — Z79899 Other long term (current) drug therapy: Secondary | ICD-10-CM | POA: Diagnosis not present

## 2021-03-21 DIAGNOSIS — R079 Chest pain, unspecified: Secondary | ICD-10-CM

## 2021-03-21 DIAGNOSIS — R0602 Shortness of breath: Secondary | ICD-10-CM | POA: Insufficient documentation

## 2021-03-21 DIAGNOSIS — R61 Generalized hyperhidrosis: Secondary | ICD-10-CM | POA: Diagnosis not present

## 2021-03-21 LAB — BASIC METABOLIC PANEL
Anion gap: 11 (ref 5–15)
BUN: 25 mg/dL — ABNORMAL HIGH (ref 8–23)
CO2: 26 mmol/L (ref 22–32)
Calcium: 9.4 mg/dL (ref 8.9–10.3)
Chloride: 98 mmol/L (ref 98–111)
Creatinine, Ser: 1.13 mg/dL (ref 0.61–1.24)
GFR, Estimated: >60 mL/min (ref >60)
Glucose, Bld: 205 mg/dL — ABNORMAL HIGH (ref 70–99)
Potassium: 4.3 mmol/L (ref 3.5–5.1)
Sodium: 135 mmol/L (ref 135–145)

## 2021-03-21 LAB — CBC WITH DIFFERENTIAL/PLATELET
Abs Immature Granulocytes: 0.01 10*3/uL (ref 0.00–0.07)
Basophils Absolute: 0 10*3/uL (ref 0.0–0.1)
Basophils Relative: 1 %
Eosinophils Absolute: 0.2 10*3/uL (ref 0.0–0.5)
Eosinophils Relative: 4 %
HCT: 39.1 % (ref 39.0–52.0)
Hemoglobin: 12.2 g/dL — ABNORMAL LOW (ref 13.0–17.0)
Immature Granulocytes: 0 %
Lymphocytes Relative: 30 %
Lymphs Abs: 1.2 10*3/uL (ref 0.7–4.0)
MCH: 27.6 pg (ref 26.0–34.0)
MCHC: 31.2 g/dL (ref 30.0–36.0)
MCV: 88.5 fL (ref 80.0–100.0)
Monocytes Absolute: 0.3 10*3/uL (ref 0.1–1.0)
Monocytes Relative: 8 %
Neutro Abs: 2.3 10*3/uL (ref 1.7–7.7)
Neutrophils Relative %: 57 %
Platelets: 149 10*3/uL — ABNORMAL LOW (ref 150–400)
RBC: 4.42 MIL/uL (ref 4.22–5.81)
RDW: 14.2 % (ref 11.5–15.5)
WBC: 4 10*3/uL (ref 4.0–10.5)
nRBC: 0 % (ref 0.0–0.2)

## 2021-03-21 LAB — TROPONIN I (HIGH SENSITIVITY)
Troponin I (High Sensitivity): 5 ng/L (ref <18)
Troponin I (High Sensitivity): 5 ng/L (ref <18)

## 2021-03-21 MED ORDER — ASPIRIN 81 MG PO CHEW
324.0000 mg | CHEWABLE_TABLET | Freq: Once | ORAL | Status: AC
  Administered 2021-03-21: 324 mg via ORAL
  Filled 2021-03-21: qty 4

## 2021-03-21 NOTE — ED Provider Notes (Signed)
[facility] EMERGENCY DEPARTMENT Provider Note   CSN: [id number] Arrival date & time: 03/21/21  1411     History Chief Complaint  Patient presents with   Chest Pain    Kenneth Paul is a 60 y.o. male.  60 year old male with past medical history of diabetes, hypertension presents to ER with concern for chest pain.  Pain occurred while in the car after church.  Came on suddenly, central, nonradiating, up to 8 out of 10 in severity.  Had some associated shortness of breath and felt sweaty at the time.  This has slowly improved and currently pain is mild.  No ongoing difficulty breathing.  No dizziness, no room spinning sensation.  Non-smoker.  No prior history of coronary artery disease.  Has not had episode of pain like this before.      Past Medical History:  Diagnosis Date   Diabetes mellitus    Hypertension     There are no problems to display for this patient.   Past Surgical History:  Procedure Laterality Date   APPENDECTOMY     HERNIA REPAIR         No family history on file.  Social History   Tobacco Use   Smoking status: Former   Smokeless tobacco: Never  Substance Use Topics   Alcohol use: No   Drug use: No    Home Medications Prior to Admission medications   Medication Sig Start Date End Date Taking? Authorizing Provider  benzonatate (TESSALON) 100 MG capsule Take 1 capsule (100 mg total) by mouth 3 (three) times daily as needed for cough. 09/01/20   Molpus, John, MD  glimepiride (AMARYL) 4 MG tablet Take 4 mg by mouth every morning. 06/10/20   [provider]  metFORMIN (GLUCOPHAGE) 1000 MG tablet Take 1 tablet (1,000 mg total) by mouth 2 (two) times daily with a meal. 05/20/15   Molpus, John, MD  naproxen (NAPROSYN) 375 MG tablet Take 1 tablet twice daily as needed for fever or body aches. 09/01/20   Molpus, John, MD  ondansetron (ZOFRAN ODT) 4 MG disintegrating tablet Take 1 tablet (4 mg total) by mouth every 8 (eight) hours as  needed. 10/07/17   Pricilla Loveless, MD  pantoprazole (PROTONIX) 40 MG tablet Take 1 tablet (40 mg total) by mouth daily. 10/07/17   Pricilla Loveless, MD  Pioglitazone HCl (ACTOS PO) Take by mouth.    [provider]    Allergies    Tetanus toxoids  Review of Systems   Review of Systems  Constitutional:  Negative for chills and fever.  HENT:  Negative for ear pain and sore throat.   Eyes:  Negative for pain and visual disturbance.  Respiratory:  Positive for chest tightness and shortness of breath. Negative for cough.   Cardiovascular:  Positive for chest pain. Negative for palpitations.  Gastrointestinal:  Negative for abdominal pain and vomiting.  Genitourinary:  Negative for dysuria and hematuria.  Musculoskeletal:  Negative for arthralgias and back pain.  Skin:  Negative for color change and rash.  Neurological:  Negative for seizures and syncope.  All other systems reviewed and are negative.  Physical Exam Updated Vital Signs BP (!) 150/93   Pulse 74   Temp 97.9 F (36.6 C) (Oral)   Resp 14   Wt 96.6 kg   SpO2 96%   BMI 28.89 kg/m   Physical Exam Vitals and nursing note reviewed.  Constitutional:      Appearance: He is well-developed.  HENT:  Head: Normocephalic and atraumatic.  Eyes:     Conjunctiva/sclera: Conjunctivae normal.  Cardiovascular:     Rate and Rhythm: Normal rate and regular rhythm.     Heart sounds: No murmur heard. Pulmonary:     Effort: Pulmonary effort is normal. No respiratory distress.     Breath sounds: Normal breath sounds.  Abdominal:     Palpations: Abdomen is soft.     Tenderness: There is no abdominal tenderness.  Musculoskeletal:     Cervical back: Neck supple.     Right lower leg: No edema.     Left lower leg: No edema.  Skin:    General: Skin is warm and dry.  Neurological:     General: No focal deficit present.     Mental Status: He is alert.  Psychiatric:        Mood and Affect: Mood normal.        Behavior:  Behavior normal.    ED Results / Procedures / Treatments   Labs (all labs ordered are listed, but only abnormal results are displayed) Labs Reviewed  CBC WITH DIFFERENTIAL/PLATELET - Abnormal; Notable for the following components:      Result Value   Hemoglobin 12.2 (*)    Platelets 149 (*)    All other components within normal limits  BASIC METABOLIC PANEL - Abnormal; Notable for the following components:   Glucose, Bld 205 (*)    BUN 25 (*)    All other components within normal limits  TROPONIN I (HIGH SENSITIVITY)  TROPONIN I (HIGH SENSITIVITY)    EKG EKG Interpretation  Date/Time:  Sunday March 21 2021 14:18:29 EDT Ventricular Rate:  75 PR Interval:  160 QRS Duration: 96 QT Interval:  416 QTC Calculation: 464 R Axis:   -6 Text Interpretation: Normal sinus rhythm Moderate voltage criteria for LVH, may be normal variant ( R in aVL , Cornell product ) Nonspecific T wave abnormality Prolonged QT Abnormal ECG Confirmed by Teyona Nichelson ([id number]) on 03/21/2021 3:03:09 PM  Radiology DG Chest 2 View  Result Date: 03/21/2021 CLINICAL DATA:  Chest pain EXAM: CHEST - 2 VIEW COMPARISON:  08/03/2019 FINDINGS: The heart size and mediastinal contours are within normal limits. Both lungs are clear. The visualized skeletal structures are unremarkable. IMPRESSION: No active cardiopulmonary disease. Electronically Signed   By: Kevin  Dover M.D.   On: 03/21/2021 15:48    Procedures Procedures   Medications Ordered in ED Medications  aspirin chewable tablet 324 mg (324 mg Oral Given 03/21/21 1542)    ED Course  I have reviewed the triage vital signs and the nursing notes.  Pertinent labs & imaging results that were available during my care of the patient were reviewed by me and considered in my medical decision making (see chart for details).    MDM Rules/Calculators/A&P                           59  year old male with history of diabetes, hypertension presenting to ER with  concern for episode of chest pain.  On exam, patient appears well in no distress.  Initial triage vital was hypertensive but repeat without intervention patient remained in 140s systolic.  In ER symptoms initially mild and then completely resolved.  His EKG does not have any obvious ischemic changes.  His troponin x2 is within normal limits.  Pain occurs at rest, not with exertion.  Given these findings, low suspicion for ACS at present.  Given he  does not have any ongoing pain and appears well, very low suspicion for dissection.  Given no hypoxia, tachycardia or tachypnea, doubt PE.  Chest x-ray negative.  Given work-up today and his reassuring appearance at present believe he can be managed in the outpatient setting.  Recommend he follow-up with his primary doctor to discuss this episode.  Given strict return precautions with patient and son at bedside.  After the discussed management above, the patient was determined to be safe for discharge.  The patient was in agreement with this plan and all questions regarding their care were answered.  ED return precautions were discussed and the patient will return to the ED with any significant worsening of condition.  Final Clinical Impression(s) / ED Diagnoses Final diagnoses:  Chest pain, unspecified type    Rx / DC Orders ED Discharge Orders     None        Milagros Loll, MD 03/21/21 1818

## 2021-03-21 NOTE — ED Notes (Signed)
Patient transported to X-ray 

## 2021-03-21 NOTE — ED Notes (Signed)
Patient Alert and oriented to baseline. Stable and ambulatory to baseline. Patient verbalized understanding of the discharge instructions.  Patient belongings were taken by the patient.   

## 2021-03-21 NOTE — ED Notes (Signed)
Attempted IV access/blood draw x2 without success.   He describes that he felt like his heart was about to 'explode out of his chest' earlier.  At this time it is a 5/10 of right to mid chest pain.   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  Site is still healing and he wears a pressure off loading boot.

## 2021-03-21 NOTE — Discharge Instructions (Addendum)
Please schedule follow-up appoint with your primary doctor, ideally to be seen within the next few days.  Discussed the episode of chest pain and discussed additional testing but they may feel is appropriate.  If you have any recurrent episodes of pain, difficulty breathing or other new concerning symptom, come back to ER for reassessment.

## 2021-03-21 NOTE — ED Notes (Signed)
ED Provider at bedside. 

## 2021-03-21 NOTE — ED Triage Notes (Signed)
Pt is here for chest which began about 40 minutes ago.  Pt also reports some sob with this and is diaphoretic.  Pt also has had some dizziness with this.  No focal weakness.

## 2021-04-13 ENCOUNTER — Encounter: Payer: Self-pay | Admitting: Ophthalmology

## 2021-04-20 ENCOUNTER — Ambulatory Visit: Admission: RE | Admit: 2021-04-20 | Payer: BC Managed Care – PPO | Source: Ambulatory Visit | Admitting: Ophthalmology

## 2021-04-20 ENCOUNTER — Encounter: Admission: RE | Payer: Self-pay | Source: Ambulatory Visit

## 2021-04-20 SURGERY — PHACOEMULSIFICATION, CATARACT, WITH IOL INSERTION
Anesthesia: Topical | Laterality: Right

## 2021-05-04 ENCOUNTER — Ambulatory Visit: Admit: 2021-05-04 | Payer: BC Managed Care – PPO | Admitting: Ophthalmology

## 2021-05-04 SURGERY — PHACOEMULSIFICATION, CATARACT, WITH IOL INSERTION
Anesthesia: Topical | Laterality: Left

## 2022-02-24 IMAGING — CR DG CHEST 2V
2 series · 2 of 2 positions shown · non-contrast
Comparison: 08/03/2019

CLINICAL DATA: Chest pain

EXAM:
CHEST - 2 VIEW

[w chest pa]
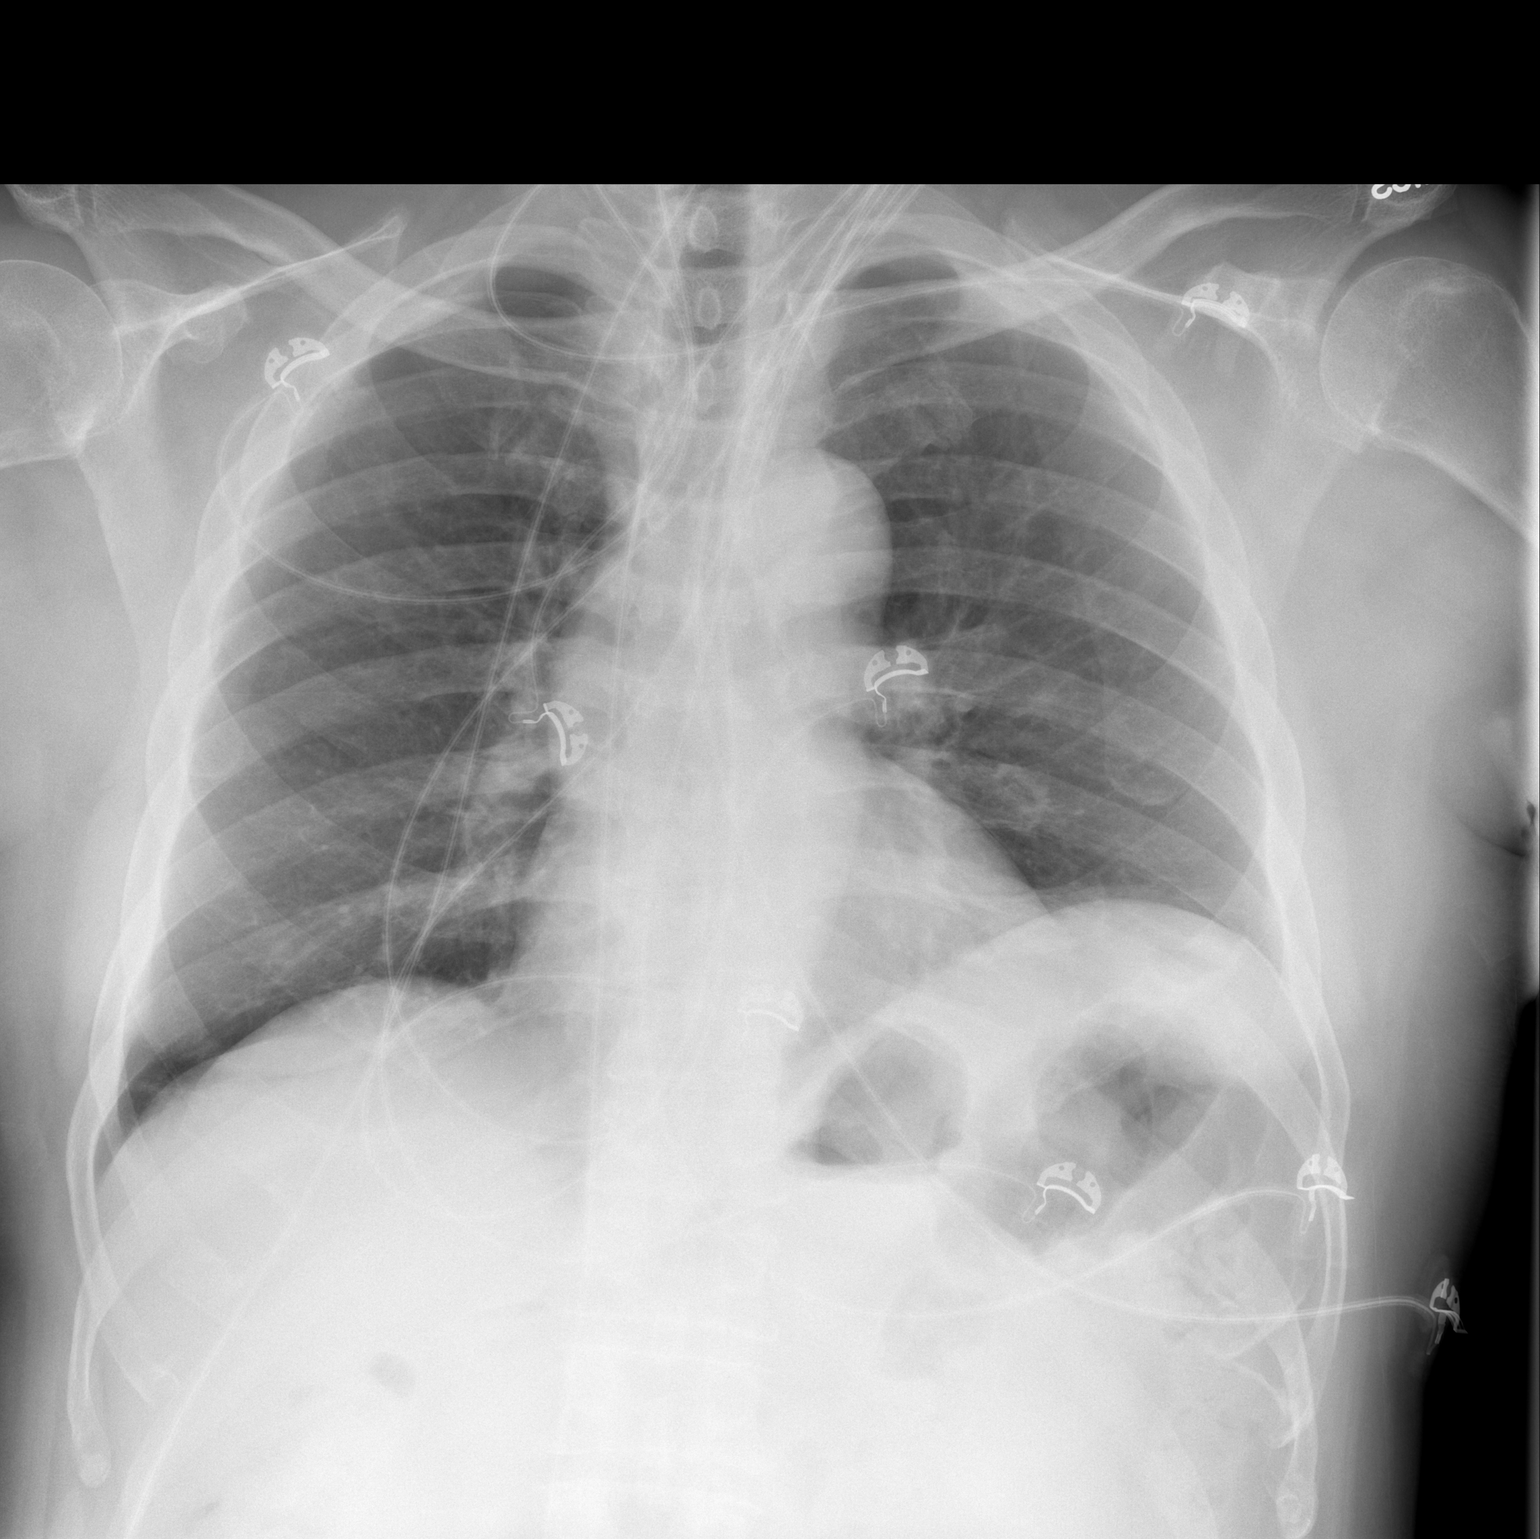

[w chest lat]
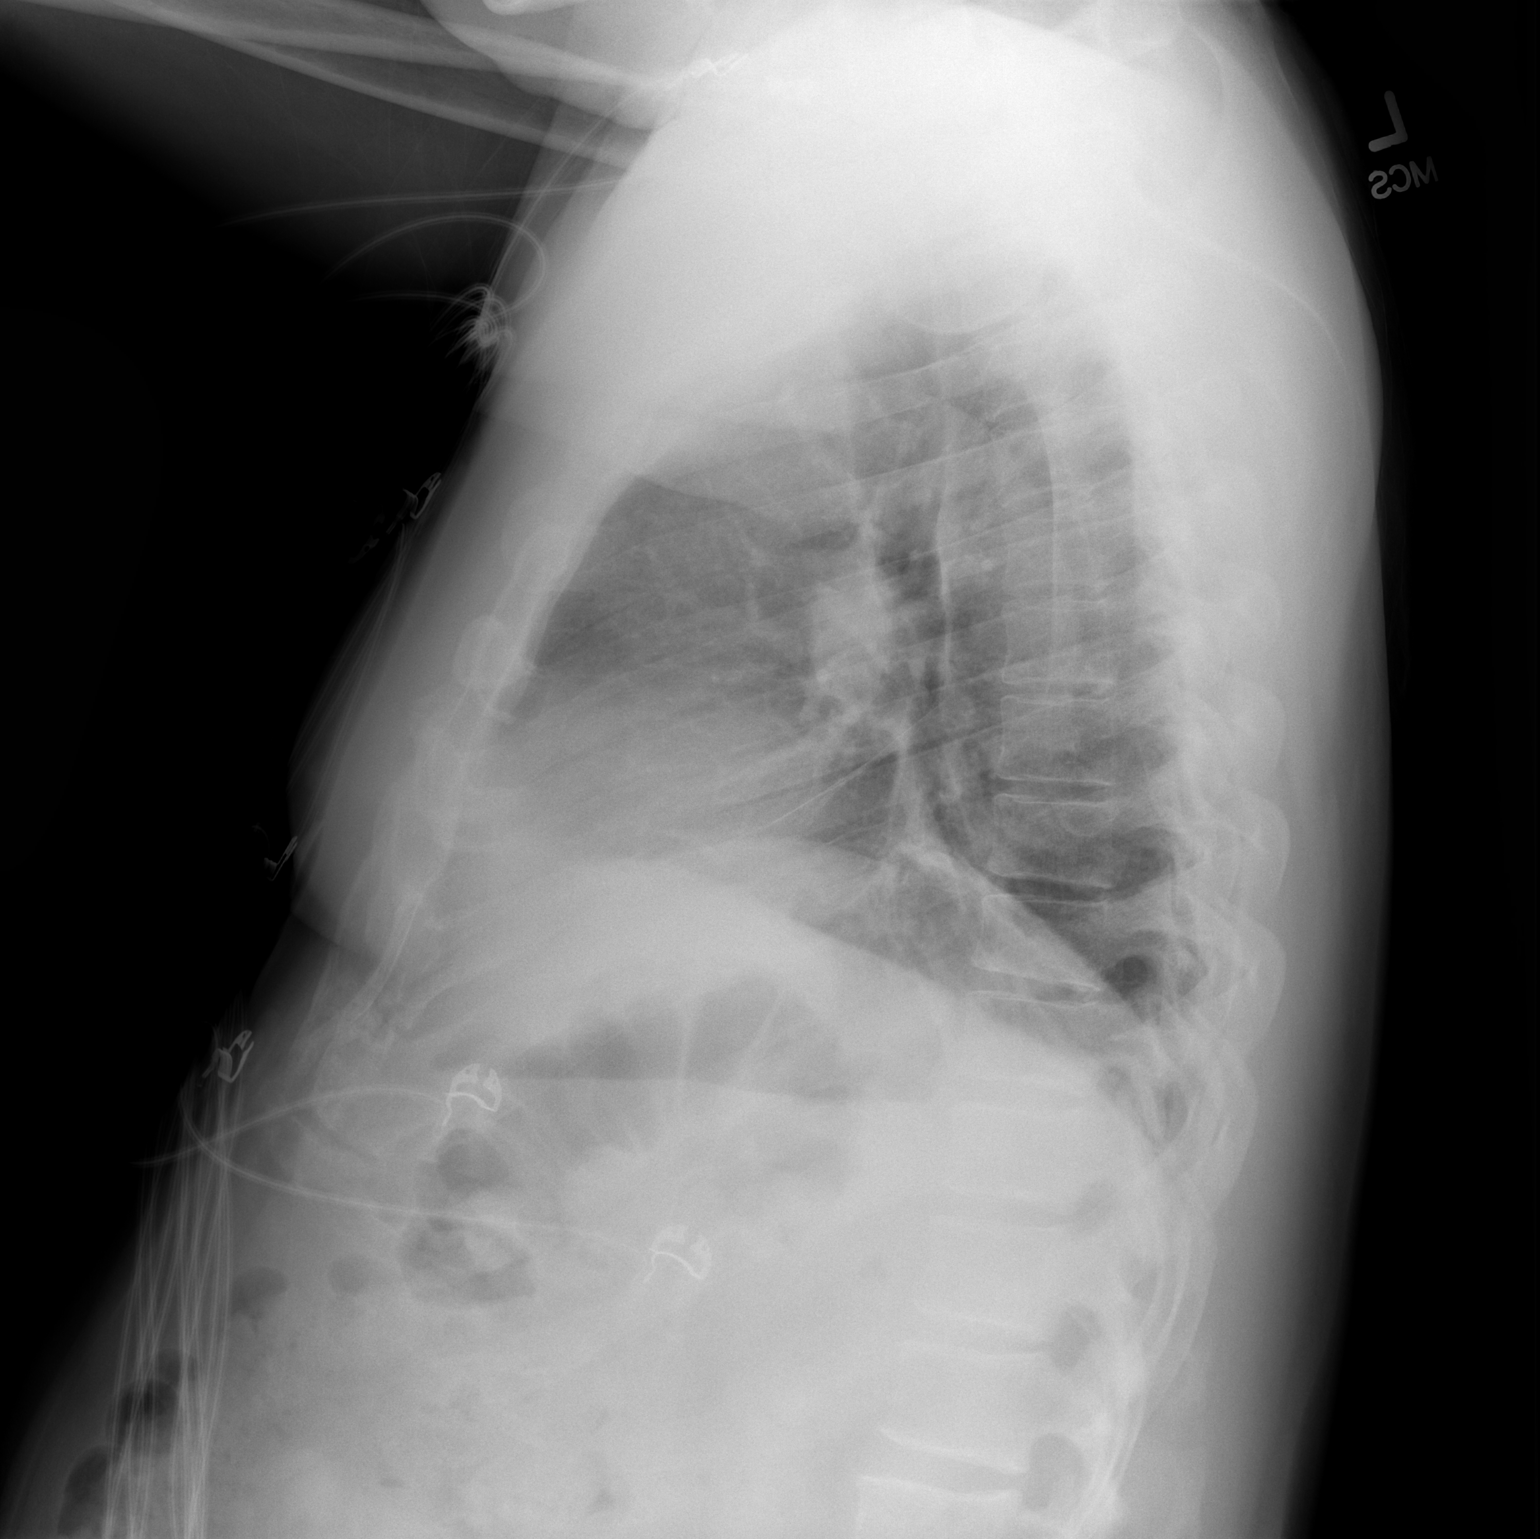

[2 of 2 positions shown; findings below may reference images not displayed]

FINDINGS: The heart size and mediastinal contours are within normal limits.
Both lungs are clear. The visualized skeletal structures are
unremarkable.
IMPRESSION: No active cardiopulmonary disease.
